# Patient Record
Sex: Female | Born: 1975
Health system: Southern US, Community
[De-identification: ages and names within clinical notes are randomized; demographics above are authoritative.]

## PROBLEM LIST (undated history)

## (undated) DIAGNOSIS — M1990 Unspecified osteoarthritis, unspecified site: Secondary | ICD-10-CM

## (undated) HISTORY — PX: TUBAL LIGATION: SHX77

---

## 2015-10-01 ENCOUNTER — Emergency Department (HOSPITAL_BASED_OUTPATIENT_CLINIC_OR_DEPARTMENT_OTHER)
Admission: EM | Admit: 2015-10-01 | Discharge: 2015-10-01 | Disposition: A | Discharge status: Home / Self Care | Payer: Medicaid Other | Attending: Emergency Medicine | Admitting: Emergency Medicine

## 2015-10-01 ENCOUNTER — Encounter (HOSPITAL_BASED_OUTPATIENT_CLINIC_OR_DEPARTMENT_OTHER): Payer: Self-pay | Admitting: *Deleted

## 2015-10-01 DIAGNOSIS — Z9851 Tubal ligation status: Secondary | ICD-10-CM | POA: Insufficient documentation

## 2015-10-01 DIAGNOSIS — N898 Other specified noninflammatory disorders of vagina: Secondary | ICD-10-CM | POA: Insufficient documentation

## 2015-10-01 DIAGNOSIS — K529 Noninfective gastroenteritis and colitis, unspecified: Secondary | ICD-10-CM | POA: Insufficient documentation

## 2015-10-01 DIAGNOSIS — Z3202 Encounter for pregnancy test, result negative: Secondary | ICD-10-CM | POA: Insufficient documentation

## 2015-10-01 LAB — URINALYSIS, ROUTINE W REFLEX MICROSCOPIC
BILIRUBIN URINE: NEGATIVE
Glucose, UA: NEGATIVE mg/dL
HGB URINE DIPSTICK: NEGATIVE
Ketones, ur: NEGATIVE mg/dL
Leukocytes, UA: NEGATIVE
NITRITE: NEGATIVE
PROTEIN: NEGATIVE mg/dL
SPECIFIC GRAVITY, URINE: 1.025 (ref 1.005–1.030)
pH: 5.5 (ref 5.0–8.0)

## 2015-10-01 LAB — PREGNANCY, URINE: PREG TEST UR: NEGATIVE

## 2015-10-01 MED ORDER — ONDANSETRON 8 MG PO TBDP
8.0000 mg | ORAL_TABLET | Freq: Three times a day (TID) | ORAL | Status: DC | PRN
Start: 2015-10-01 — End: 2022-08-05

## 2015-10-01 MED FILL — ONDANSETRON ODT 8 MG TABLET: 8 | 7 days supply | Qty: 20 | Fill #0

## 2015-10-01 NOTE — Discharge Instructions (Signed)

## 2015-10-01 NOTE — ED Notes (Signed)
Abdominal pain, vomiting and diarrhea since yesterday. States she feels like she has a "stomach virus" per pt.

## 2015-10-01 NOTE — ED Provider Notes (Signed)
CSN: [id number]     Arrival date & time 10/01/15  1410 History  By signing my name below, I, Budd Palmer, attest that this documentation has been prepared under the direction and in the presence of Margarita Grizzle, MD. Electronically Signed: Budd Palmer, ED Scribe. 10/01/2015. 4:27 PM.    Chief Complaint  Patient presents with  . Abdominal Pain   The history is provided by the patient. No language interpreter was used.   HPI Comments: Kimberly Briggs is a 40 y.o. female who presents to the Emergency Department complaining of constant, burning, cramping abdominal pain onset 3 days ago. She reports associated diarrhea (4-5x over the weekend, none today), nausea, and vomiting (resolved). She notes this feels like a previous stomach virus she has had in the past. She also notes vaginal discharge and frequency of urination, but states that this is baseline for her. She reports exacerbation of the pain with eating, and alleviation with lying supine. She notes she had a cough and rhinorrhea a few week ago, which have since resolved. She reports a PSHx of tubal ligation and notes her periods are regular. Her LNMP occurred on 12/13. She notes she drinks alcohol occasionally, but has not done so recently. 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 [facility] She denies smoking. Pt denies fever, chills, and dysuria.   History reviewed. No pertinent past medical history. Past Surgical History  Procedure Laterality Date  . Tubal ligation     No family history on file. Social History  Substance Use Topics  . Smoking status: Never Smoker   . Smokeless tobacco: None  . Alcohol Use: Yes     Comment: occ   OB History    No data available     Review of Systems  Constitutional: Negative for fever and chills.  Gastrointestinal: Positive for nausea, vomiting, abdominal pain and diarrhea.  Genitourinary: Positive for frequency  and vaginal discharge. Negative for dysuria.  All other systems reviewed and are negative.   Allergies  Review of patient's allergies indicates no known allergies.  Home Medications   Prior to Admission medications   Not on File   BP 142/86 mmHg  Pulse 86  Temp(Src) 98.1 F (36.7 C) (Oral)  Resp 16  SpO2 100%  LMP 09/11/2015 Physical Exam  Constitutional: She is oriented to person, place, and time. She appears well-developed and well-nourished.  HENT:  Head: Normocephalic and atraumatic.  Right Ear: External ear normal.  Left Ear: External ear normal.  Nose: Nose normal.  Mouth/Throat: Oropharynx is clear and moist.  Eyes: Conjunctivae and EOM are normal. Pupils are equal, round, and reactive to light.  Neck: Normal range of motion. Neck supple.  Cardiovascular: Normal rate, regular rhythm, normal heart sounds and intact distal pulses.   Pulmonary/Chest: Effort normal and breath sounds normal.  Abdominal: Soft. Bowel sounds are normal. She exhibits no distension and no mass. There is no tenderness. There is no rebound and no guarding.  Musculoskeletal: Normal range of motion.  Neurological: She is alert and oriented to person, place, and time. She has normal reflexes.  Skin: Skin is warm and dry.  Psychiatric: She has a normal mood and affect. Her behavior is normal. Judgment and thought content normal.  Nursing note and vitals reviewed.   ED Course  Procedures  DIAGNOSTIC STUDIES: Oxygen Saturation is 100% on RA, normal by my interpretation.    COORDINATION OF  CARE: 4:26 PM - Discussed probable viral infection. Discussed plans to order anti-nausea medication. Will write a note for work.  Pt advised of plan for treatment and pt agrees.  Labs Review Labs Reviewed  URINALYSIS, ROUTINE W REFLEX MICROSCOPIC (NOT AT [facility]) - Abnormal; Notable for the following:    APPearance CLOUDY (*)    All other components within normal limits  PREGNANCY, URINE    Imaging  Review No results found. I have personally reviewed and evaluated these images and lab results as part of my medical decision-making.   EKG Interpretation None      MDM   Final diagnoses:  Gastroenteritis    I personally performed the services described in this documentation, which was scribed in my presence. The recorded information has been reviewed and considered.   Margarita Grizzleanielle Zerah Hilyer, MD 10/01/15 [phone]

## 2015-10-01 NOTE — ED Notes (Signed)
Pt. Reports pain in her belly when she tries to eat.  Pt. Reports she tried to eat chicken fingers and it hurt her belly.  Pt. Has had NV&D since Sat. And none today.

## 2015-10-01 NOTE — ED Notes (Signed)
Pt. Reports she missed work today because she probably picked up a bug at work anyway.

## 2016-01-03 ENCOUNTER — Emergency Department (HOSPITAL_BASED_OUTPATIENT_CLINIC_OR_DEPARTMENT_OTHER)
Admission: EM | Admit: 2016-01-03 | Discharge: 2016-01-03 | Disposition: A | Discharge status: Home / Self Care | Payer: Medicaid Other | Attending: Emergency Medicine | Admitting: Emergency Medicine

## 2016-01-03 ENCOUNTER — Encounter (HOSPITAL_BASED_OUTPATIENT_CLINIC_OR_DEPARTMENT_OTHER): Payer: Self-pay

## 2016-01-03 ENCOUNTER — Emergency Department (HOSPITAL_BASED_OUTPATIENT_CLINIC_OR_DEPARTMENT_OTHER): Payer: Medicaid Other

## 2016-01-03 DIAGNOSIS — M1712 Unilateral primary osteoarthritis, left knee: Secondary | ICD-10-CM

## 2016-01-03 NOTE — ED Provider Notes (Signed)
CSN: [id number]     Arrival date & time 01/03/16  1949 History   First MD Initiated Contact with Patient 01/03/16 2001     Chief Complaint  Patient presents with  . Knee Pain    HPI   40 year old female presents today with complaints of knee pain. Patient reports symptoms have been present for approximately 3 months. She describes the pain is achy, worse with ambulation. She reports that occasionally she has sharp shooting pains in her knee. She notes that. The rest improved pain. She has not tried any medications prior to arrival. 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 Patient has no significant past medical history of knee pain. She denies any swelling, edema, redness, warmth to touch, or any systemic illnesses.  History reviewed. No pertinent past medical history. Past Surgical History  Procedure Laterality Date  . Tubal ligation     No family history on file. Social History  Substance Use Topics  . Smoking status: Never Smoker   . Smokeless tobacco: None  . Alcohol Use: Yes     Comment: occ   OB History    No data available     Review of Systems  All other systems reviewed and are negative.   Allergies  Review of patient's allergies indicates no known allergies.  Home Medications   Prior to Admission medications   Medication Sig Start Date End Date Taking? Authorizing Provider  ondansetron (ZOFRAN ODT) 8 MG disintegrating tablet Take 1 tablet (8 mg total) by mouth every 8 (eight) hours as needed for nausea or vomiting. 10/01/15   Margarita Grizzle, MD   BP 119/73 mmHg  Pulse 83  Temp(Src) 98.5 F (36.9 C) (Oral)  Resp 18  Ht  (1.727 m)  Wt 100.699 kg  BMI 33.76 kg/m2  SpO2 99%  LMP 12/25/2015   Physical Exam  Constitutional: She is oriented to person, place, and time. She appears well-developed and well-nourished.  HENT:  Head: Normocephalic and atraumatic.  Eyes: Conjunctivae are normal. Pupils are equal, round,  and reactive to light. Right eye exhibits no discharge. Left eye exhibits no discharge. No scleral icterus.  Neck: Normal range of motion. No JVD present. No tracheal deviation present.  Pulmonary/Chest: Effort normal. No stridor.  Musculoskeletal:  External inspection shows no asymmetry of the knees, no swelling, edema, warmth to touch. Full active pain-free range of motion of the left knee. No pain with patellar grind test, Lachman's negative, posterior drawer negative, no valgus or varus laxity noted. Strength 5 out of 5, sensation intact.  Neurological: She is alert and oriented to person, place, and time. Coordination normal.  Psychiatric: She has a normal mood and affect. Her behavior is normal. Judgment and thought content normal.  Nursing note and vitals reviewed.   ED Course  Procedures (including critical care time) Labs Review Labs Reviewed - No data to display  Imaging Review Dg Knee Complete 4 Views Left  01/03/2016  CLINICAL DATA:  Nontraumatic left anterior knee pain for 3 months. EXAM: LEFT KNEE - COMPLETE 4+ VIEW COMPARISON:  None. FINDINGS: Negative for acute fracture dislocation. There is no bone lesion or bony destruction. There are severe osteoarthritic changes, greatest in the medial compartment but also moderately severe in the patellofemoral and lateral compartments. No significant soft tissue abnormality is evident. IMPRESSION: Osteoarthritis.  No acute findings are evident Electronically Signed   By: Ellery Plunk M.D.   On: 01/03/2016 21:00  I have personally reviewed and evaluated these images and lab results as part of my medical decision-making.   EKG Interpretation None      MDM   Final diagnoses:  Osteoarthritis of left knee, unspecified osteoarthritis type    Labs:  Imaging:DG knee  Consults:  Therapeutics:  Discharge Meds:   Assessment/Plan: 40 year old female presents today with osteoarthritis of the left knee. Symptoms have been going  on for 3 months, no acute worsening. She has no signs of septic arthritis, no signs of gouty arthritis, no signs of trauma. X-ray shows osteoarthritis. Patient instructed to use Tylenol, follow up with sports medicine professional for further evaluation and management. Patient given return precautions, verbalized understanding and agreement to today's plan.         Eyvonne MechanicJeffrey Jamicia Haaland, PA-C 01/03/16 2109  Geoffery Lyonsouglas Delo, MD 01/03/16 [phone]

## 2016-01-03 NOTE — ED Notes (Signed)
Left knee pain for at least the last three months, no known injury

## 2016-01-03 NOTE — Discharge Instructions (Signed)

## 2017-04-19 IMAGING — DX DG KNEE COMPLETE 4+V*L*
4 series · 4 of 4 positions shown · non-contrast
Comparison: None.

CLINICAL DATA: Nontraumatic left anterior knee pain for 3 months.

EXAM:
LEFT KNEE - COMPLETE 4+ VIEW

[knee ap]
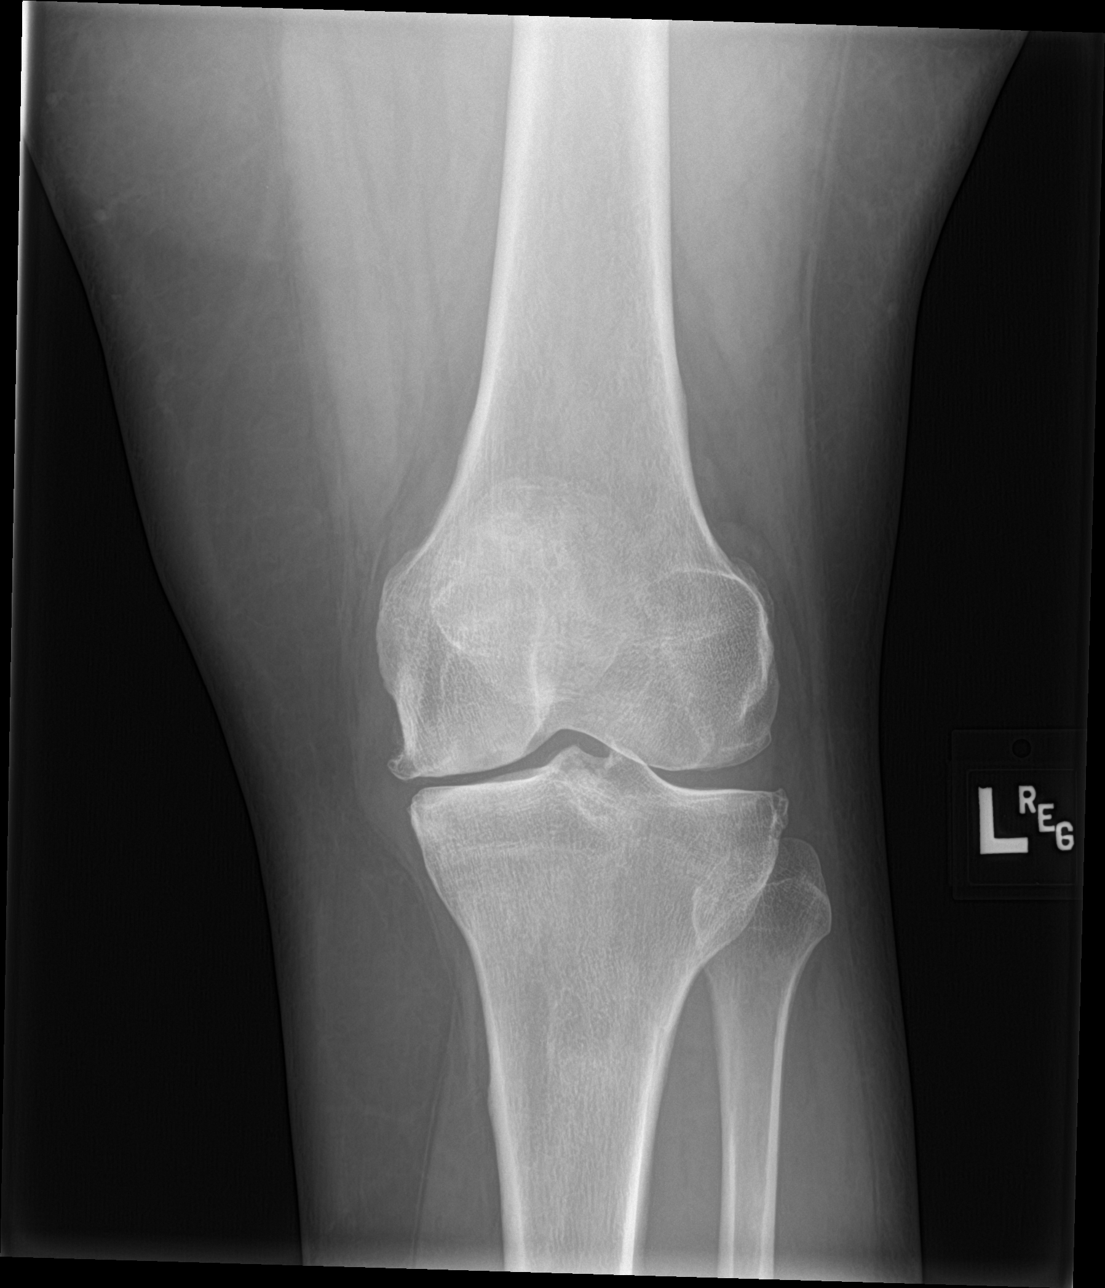

[knee lat]
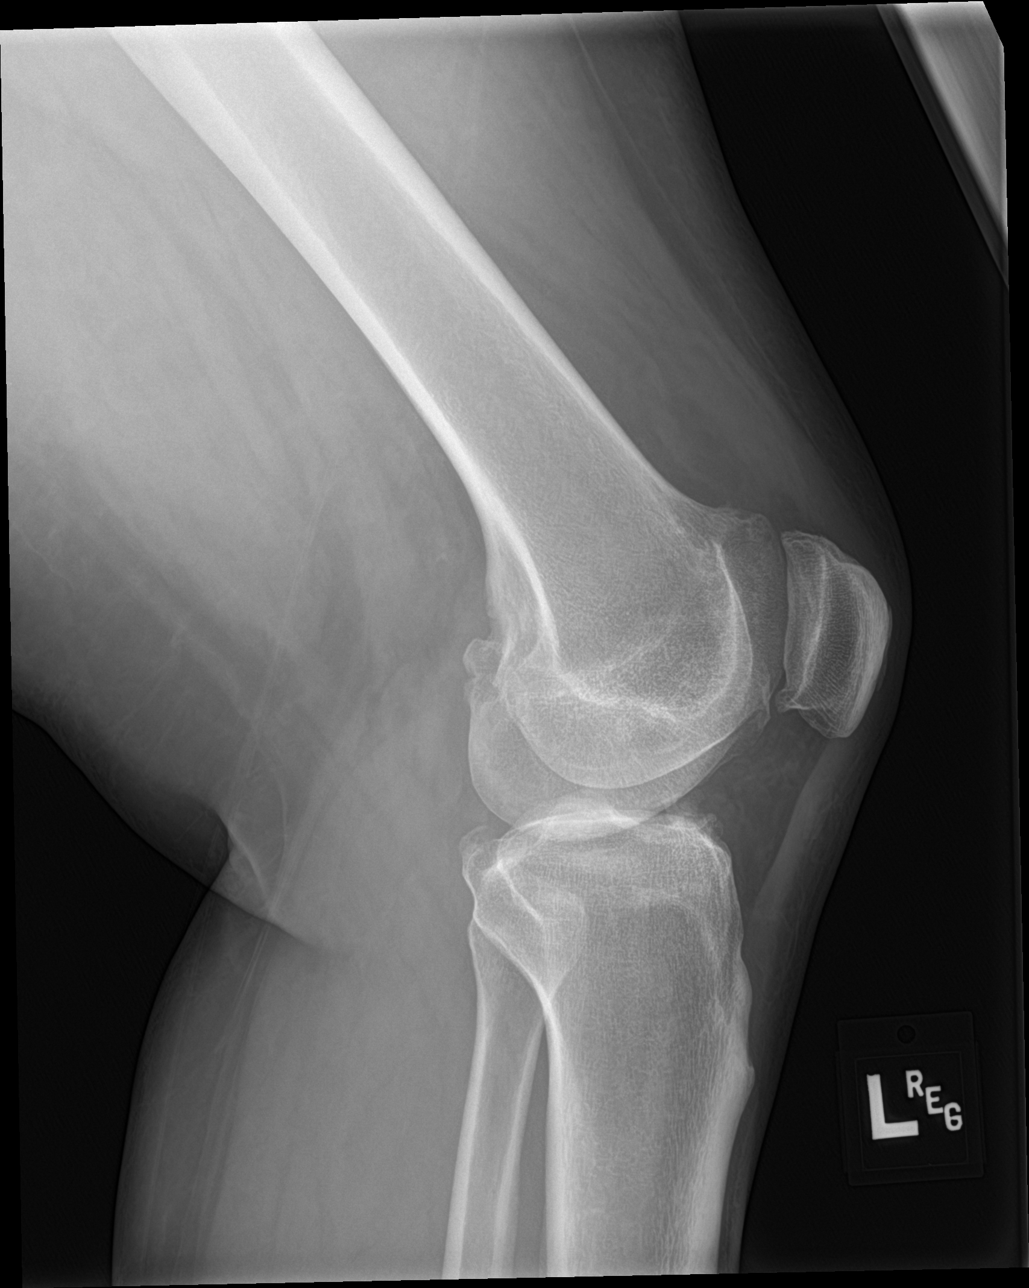

[knee obl (1 of 2)]
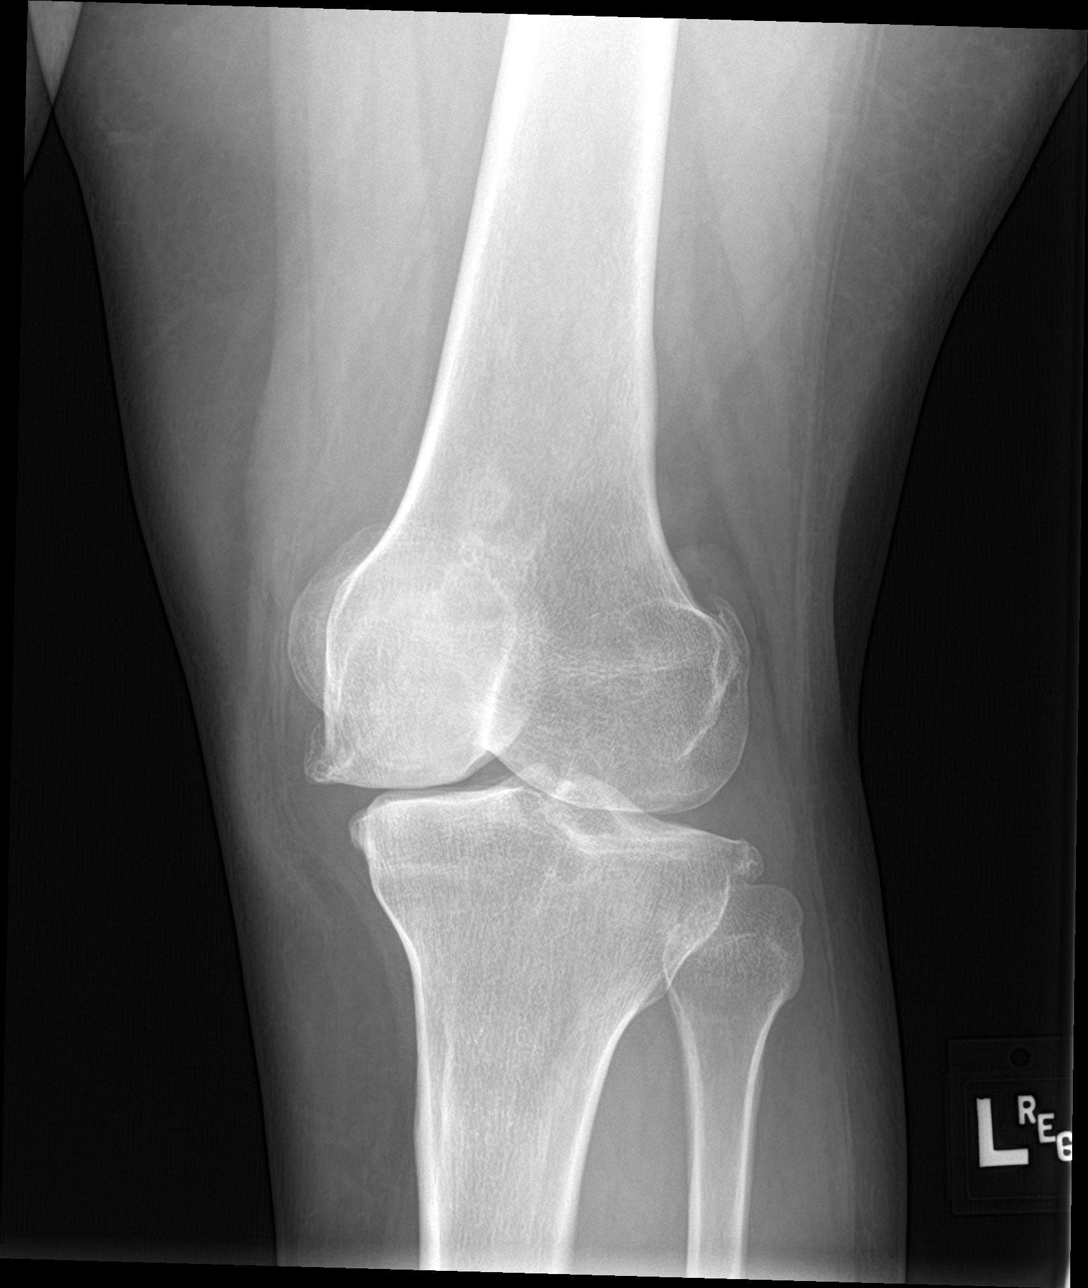

[knee obl (2 of 2)]
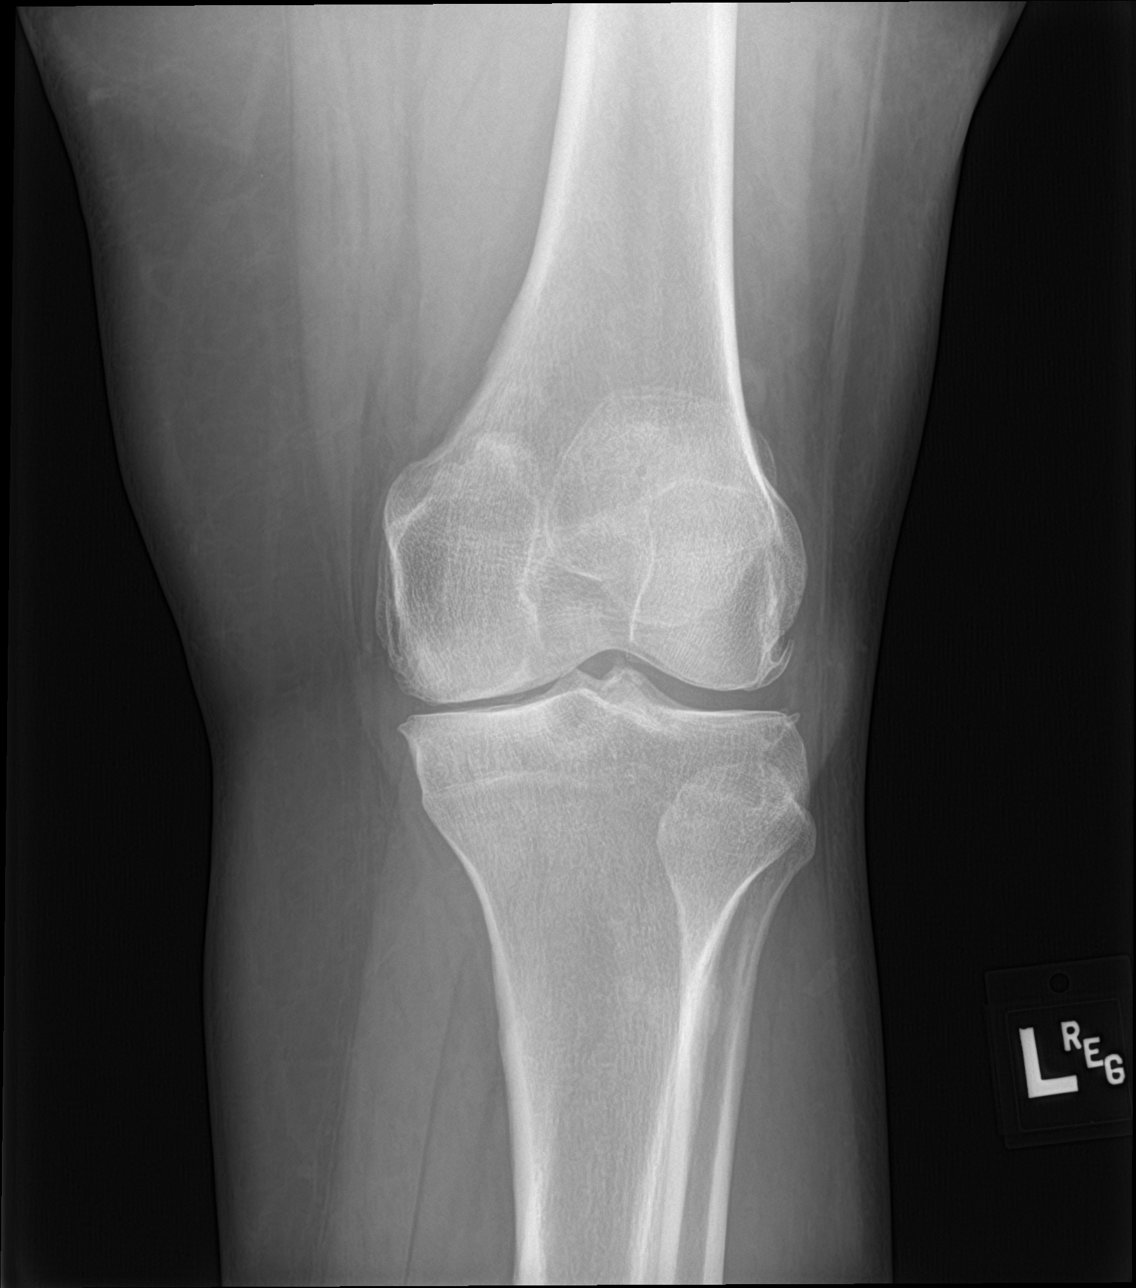

[4 of 4 positions shown; findings below may reference images not displayed]

FINDINGS: Negative for acute fracture dislocation. There is no bone lesion or
bony destruction. There are severe osteoarthritic changes, greatest
in the medial compartment but also moderately severe in the
patellofemoral and lateral compartments. No significant soft tissue
abnormality is evident.
IMPRESSION: Osteoarthritis.  No acute findings are evident

## 2022-07-31 ENCOUNTER — Emergency Department (HOSPITAL_COMMUNITY): Payer: BLUE CROSS/BLUE SHIELD

## 2022-07-31 ENCOUNTER — Other Ambulatory Visit: Payer: Self-pay

## 2022-07-31 ENCOUNTER — Inpatient Hospital Stay (HOSPITAL_COMMUNITY)
Admission: EM | Admit: 2022-07-31 | Discharge: 2022-08-05 | DRG: 099 | Disposition: A | Discharge status: Home / Self Care | Payer: BLUE CROSS/BLUE SHIELD | Attending: Internal Medicine | Admitting: Internal Medicine

## 2022-07-31 ENCOUNTER — Encounter (HOSPITAL_COMMUNITY): Payer: Self-pay

## 2022-07-31 DIAGNOSIS — G8929 Other chronic pain: Secondary | ICD-10-CM | POA: Diagnosis present

## 2022-07-31 DIAGNOSIS — Z885 Allergy status to narcotic agent status: Secondary | ICD-10-CM | POA: Diagnosis not present

## 2022-07-31 DIAGNOSIS — Z96652 Presence of left artificial knee joint: Secondary | ICD-10-CM | POA: Diagnosis present

## 2022-07-31 DIAGNOSIS — R339 Retention of urine, unspecified: Secondary | ICD-10-CM | POA: Diagnosis present

## 2022-07-31 DIAGNOSIS — R29898 Other symptoms and signs involving the musculoskeletal system: Secondary | ICD-10-CM | POA: Diagnosis not present

## 2022-07-31 DIAGNOSIS — D72829 Elevated white blood cell count, unspecified: Secondary | ICD-10-CM | POA: Diagnosis present

## 2022-07-31 DIAGNOSIS — R202 Paresthesia of skin: Secondary | ICD-10-CM | POA: Diagnosis not present

## 2022-07-31 DIAGNOSIS — G373 Acute transverse myelitis in demyelinating disease of central nervous system: Principal | ICD-10-CM

## 2022-07-31 DIAGNOSIS — R0789 Other chest pain: Secondary | ICD-10-CM | POA: Diagnosis present

## 2022-07-31 DIAGNOSIS — R531 Weakness: Secondary | ICD-10-CM

## 2022-07-31 DIAGNOSIS — R200 Anesthesia of skin: Principal | ICD-10-CM

## 2022-07-31 HISTORY — DX: Unspecified osteoarthritis, unspecified site: M19.90

## 2022-07-31 LAB — CBC WITH DIFFERENTIAL/PLATELET
Abs Immature Granulocytes: 0.07 10*3/uL (ref 0.00–0.07)
Basophils Absolute: 0 10*3/uL (ref 0.0–0.1)
Basophils Relative: 0 %
Eosinophils Absolute: 0 10*3/uL (ref 0.0–0.5)
Eosinophils Relative: 0 %
HCT: 39.7 % (ref 36.0–46.0)
Hemoglobin: 13.1 g/dL (ref 12.0–15.0)
Immature Granulocytes: 1 %
Lymphocytes Relative: 18 %
Lymphs Abs: 2.5 10*3/uL (ref 0.7–4.0)
MCH: 29.7 pg (ref 26.0–34.0)
MCHC: 33 g/dL (ref 30.0–36.0)
MCV: 90 fL (ref 80.0–100.0)
Monocytes Absolute: 0.6 10*3/uL (ref 0.1–1.0)
Monocytes Relative: 5 %
Neutro Abs: 10.3 10*3/uL — ABNORMAL HIGH (ref 1.7–7.7)
Neutrophils Relative %: 76 %
Platelets: 388 10*3/uL (ref 150–400)
RBC: 4.41 MIL/uL (ref 3.87–5.11)
RDW: 14.3 % (ref 11.5–15.5)
WBC: 13.5 10*3/uL — ABNORMAL HIGH (ref 4.0–10.5)
nRBC: 0 % (ref 0.0–0.2)

## 2022-07-31 LAB — COMPREHENSIVE METABOLIC PANEL
ALT: 13 U/L (ref 0–44)
AST: 21 U/L (ref 15–41)
Albumin: 3.6 g/dL (ref 3.5–5.0)
Alkaline Phosphatase: 61 U/L (ref 38–126)
Anion gap: 14 (ref 5–15)
BUN: 14 mg/dL (ref 6–20)
CO2: 23 mmol/L (ref 22–32)
Calcium: 9.7 mg/dL (ref 8.9–10.3)
Chloride: 104 mmol/L (ref 98–111)
Creatinine, Ser: 0.72 mg/dL (ref 0.44–1.00)
GFR, Estimated: >60 mL/min (ref >60)
Glucose, Bld: 97 mg/dL (ref 70–99)
Potassium: 4.2 mmol/L (ref 3.5–5.1)
Sodium: 141 mmol/L (ref 135–145)
Total Bilirubin: 0.3 mg/dL (ref 0.3–1.2)
Total Protein: 7.5 g/dL (ref 6.5–8.1)

## 2022-07-31 LAB — CSF CELL COUNT WITH DIFFERENTIAL
RBC Count, CSF: 0 /mm3
RBC Count, CSF: 82 /mm3 — ABNORMAL HIGH
Tube #: 4
WBC, CSF: 1 /mm3 (ref 0–5)
WBC, CSF: 3 /mm3 (ref 0–5)

## 2022-07-31 LAB — CK: Total CK: 362 U/L — ABNORMAL HIGH (ref 38–234)

## 2022-07-31 LAB — VITAMIN B12: Vitamin B-12: 521 pg/mL (ref 180–914)

## 2022-07-31 LAB — PROTEIN AND GLUCOSE, CSF
Glucose, CSF: 59 mg/dL (ref 40–70)
Total  Protein, CSF: 29 mg/dL (ref 15–45)

## 2022-07-31 MED ORDER — ACETAMINOPHEN 650 MG RE SUPP: 650.0000 mg | Freq: Four times a day (QID) | RECTAL | Status: DC | PRN

## 2022-07-31 MED ORDER — ACETAMINOPHEN 325 MG PO TABS
650.0000 mg | ORAL_TABLET | Freq: Four times a day (QID) | ORAL | Status: DC | PRN
  Administered 2022-08-03 – 2022-08-04 (×2): 650 mg via ORAL
  Filled 2022-07-31 (×2): qty 2

## 2022-07-31 MED ORDER — GADOBUTROL 1 MMOL/ML IV SOLN
10.0000 mL | Freq: Once | INTRAVENOUS | Status: AC | PRN
  Administered 2022-07-31: 10 mL via INTRAVENOUS

## 2022-07-31 NOTE — ED Provider Notes (Signed)
[place] EMERGENCY DEPARTMENT Provider Note   CSN: [id number] Arrival date & time: 07/31/22  1205     History  Chief Complaint  Patient presents with   Numbness    Kimberly Briggs is a 46 y.o. female.  Patient is a 46 yo female with pmh of lumbar disc disease presenting for numbness in the lower extremity. Pt admits to hx of URI on Monday, Oct 23rd 2023. States she sick with sore throat, nasal congestion, and productive cough from the 23-30th. [place]. Admits to resolution of URI infection on Oct 31th but began having a burning sensation/muscle spasm sensation in the bilateral lower extremities with difficulty ambulating. States she went to [facility] at this time received an MR spine thoracic and lumbar wo contrast in the ED demonstrating: 1. Normal MRI appearance of the conus medullaris and cauda equina.  2. Small central to right subarticular disc protrusion at L5-S1,  closely approximating and/or potentially affecting either of the  descending S1 nerve roots, greater on the right.  3. Small right foraminal to extraforaminal disc protrusion at L2-3,  potentially irritating the exiting right L2 nerve root.  4. Moderate bilateral facet hypertrophy at L2-3 through L4-5, which  could contribute to lower back pain.   Patient was discharged with outpatient follow up with Dr. Maia Petties with [facility] in [place] today.   Current symptoms include sensation changes in bilateral lower extremities including numbness in buttocks, vaginal area, and bilateral lower extremities. Pt states " my legs feel cold". Sensation deficits are worse on the right  lower extremity. Pt also states she has feelings of urinary retention at [facility] and had to be straight cath. States she is able to empty her bladder at this time.  Denies any motor/movement deficits. Admits to difficulty walking due to  numbness.      The history is provided by the patient. No language interpreter was used.       Home Medications Prior to Admission medications   Medication Sig Start Date End Date Taking? Authorizing Provider  ondansetron (ZOFRAN ODT) 8 MG disintegrating tablet Take 1 tablet (8 mg total) by mouth every 8 (eight) hours as needed for nausea or vomiting. 10/01/15   Pattricia Boss, MD      Allergies    Codeine    Review of Systems   Review of Systems  Constitutional:  Negative for chills and fever.  HENT:  Negative for ear pain and sore throat.   Eyes:  Negative for pain and visual disturbance.  Respiratory:  Negative for cough and shortness of breath.   Cardiovascular:  Negative for chest pain and palpitations.  Gastrointestinal:  Negative for abdominal pain and vomiting.  Genitourinary:  Negative for dysuria and hematuria.  Musculoskeletal:  Negative for arthralgias and back pain.  Skin:  Negative for color change and rash.  Neurological:  Positive for numbness. Negative for seizures and syncope.  All other systems reviewed and are negative.   Physical Exam Updated Vital Signs BP (!) 128/104 (BP Location: Right Arm)   Pulse 64   Temp 98.6 F (37 C) (Oral)   Resp 20   Ht 5\' 8"  (1.727 m)   Wt 100.7 kg   SpO2 97%   BMI 33.75 kg/m  Physical Exam Vitals and nursing note reviewed.  Constitutional:      General: She is not in acute distress.    Appearance: She is well-developed.  HENT:     Head: Normocephalic and atraumatic.  Eyes:     Conjunctiva/sclera: Conjunctivae normal.  Cardiovascular:     Rate and Rhythm: Normal rate and regular rhythm.     Heart sounds: No murmur heard. Pulmonary:     Effort: Pulmonary effort is normal. No respiratory distress.     Breath sounds: Normal breath sounds.  Abdominal:     Palpations: Abdomen is soft.     Tenderness: There is no abdominal tenderness.  Musculoskeletal:        General: No swelling.     Cervical back: Neck  supple.  Skin:    General: Skin is warm and dry.     Capillary Refill: Capillary refill takes less than 2 seconds.  Neurological:     Mental Status: She is alert and oriented to person, place, and time.     GCS: GCS eye subscore is 4. GCS verbal subscore is 5. GCS motor subscore is 6.     Cranial Nerves: Cranial nerves 2-12 are intact.     Sensory: Sensory deficit present.     Motor: Motor function is intact.     Coordination: Coordination is intact.     Comments: Pt able to detect touch. Decreased sensation in the left when compared to the right. Able to detect light versus deep touch. Able to detect sharp versus dull.   Psychiatric:        Mood and Affect: Mood normal.     ED Results / Procedures / Treatments   Labs (all labs ordered are listed, but only abnormal results are displayed) Labs Reviewed  CBC WITH DIFFERENTIAL/PLATELET - Abnormal; Notable for the following components:      Result Value   WBC 13.5 (*)    Neutro Abs 10.3 (*)    All other components within normal limits  CK - Abnormal; Notable for the following components:   Total CK 362 (*)    All other components within normal limits  CSF CULTURE W GRAM STAIN  COMPREHENSIVE METABOLIC PANEL  VITAMIN B12  CSF CELL COUNT WITH DIFFERENTIAL  PROTEIN AND GLUCOSE, CSF  IGG CSF INDEX  DRAW EXTRA CLOT TUBE  OLIGOCLONAL BANDS, CSF + SERM  DRAW EXTRA CLOT TUBE    EKG None  Radiology No results found.  Procedures .Critical Care  Performed by: Franne Forts, DO Authorized by: Franne Forts, DO   Critical care provider statement:    Critical care time (minutes):  30   Critical care was necessary to treat or prevent imminent or life-threatening deterioration of the following conditions: lower extremity sensation loss.   Critical care was time spent personally by me on the following activities:  Development of treatment plan with patient or surrogate, discussions with consultants, evaluation of patient's response  to treatment, examination of patient, ordering and review of laboratory studies, ordering and review of radiographic studies, ordering and performing treatments and interventions, pulse oximetry, re-evaluation of patient's condition and review of old charts   Care discussed with comment:  Neurology     Medications Ordered in ED Medications - No data to display  ED Course/ Medical Decision Making/ A&P                           Medical Decision Making Amount and/or Complexity of Data Reviewed Labs: ordered. Radiology: ordered.   46 yo female with pmh of lumbar disc disease presenting for numbness in the lower extremity. Pt is  Aox3, no acute distress, afebrile, with stable vitals. Tearful on exam secondary to concern for lower extremities. LE demonstrate no swelling, erythema, rashes, ecchymosis, or wounds. DP easily palpable. No motor deficits. Decreased sensation on right side as compared to left. Able to detect light touch and deep pressure. Able to detect sharp versus dull.  Differential diagnosis includes but is not limited to MS, transverse myelitis, early Guillain-Barr.   Review demonstrates MR spine thoracic and lumbar wo contrast at [facility] on 07/29/2022 demonstrating: 1. Normal MRI appearance of the conus medullaris and cauda equina.  2. Small central to right subarticular disc protrusion at L5-S1,  closely approximating and/or potentially affecting either of the  descending S1 nerve roots, greater on the right.  3. Small right foraminal to extraforaminal disc protrusion at L2-3,  potentially irritating the exiting right L2 nerve root.  4. Moderate bilateral facet hypertrophy at L2-3 through L4-5, which  could contribute to lower back pain.   I spoke with on-call neurology team who recommends an MRI cervical spine, thoracic spine, and lumbar spine with contrast as well as an LP.  Signs of early Guillain-Barr.  MRI ordered.  Oncoming physician Dr. Fredderick Phenix will  perform lumbar puncture and follow MRI results.  Plans to reconsult neurology when results are back.         Final Clinical Impression(s) / ED Diagnoses Final diagnoses:  Loss of feeling or sensation    Rx / DC Orders ED Discharge Orders     None         Mariene, Dickerman, DO 07/31/22 1523

## 2022-07-31 NOTE — Consult Note (Signed)
Neurology Consultation  Reason for Consult: Sudden onset of bilateral lower extremity weakness, abnormal MRI Referring Physician: Dr. Fredderick Phenix  CC: Lower extremity weakness  History is obtained from: Patient, chart  HPI: Kimberly Briggs is a 46 y.o. female past medical history of chronic lower back pain presenting to the emergency room for evaluation of lower extremity numbness and weakness. She reports that she had a URI last week but had a trip planned to the [place] which she went down at the end of last week and returned back on Sunday.  She was not 100% normal-had congestion in her sinuses and felt somewhat sick with upper respiratory symptoms but was able to work.  Monday was fine.  Tuesday morning she started noticing burning sensation in her feet as well as a sensation of muscle spasm in her legs.  She was also having urinary retention. She went to the emergency department at [facility] had an MRI of the lumbar spine as well as thoracic spine-07/29/2022-with no acute issues.  Was given a dose of steroids and discharged home.  Her symptoms continue to worsen to the point that she was not able to walk normally and was also having urinary retention that was worsening, which made her come to the emergency room at [facility].  Chart review also reveals that she had an urgent care visit for low back pain and slight vaginal discharge as well as exposure to STD from a sexual partner for trichomonas.  She was tested and was positive and was given antibiotics-course that she completed.  In the emergency room at [facility], she got an MRI of the thoracic and lumbar spine repeated which shows patchy signal abnormality involving the distal cord/conus medullaris at the level of T12/L1 which is new since prior MRI from 07/29/2022.  Findings are nonspecific but could reflect changes of transverse myelitis which could be either infectious or inflammatory in nature.  Demyelinating  process could also be considered but there is no associated abnormal contrast-enhancement.  There is no visible enhancement of the nerve roots of the cauda equina. Degenerative spondylosis of the lumbar and thoracic spine stable from 2 days ago. Fibroid uterus.  1.7 cm soft tissue lesion protrudes into the endometrial cavity possible it is a submucosal fibroid but incompletely assessed on this exam.  Further exam with pelvic ultrasound should be considered.  ROS: Full ROS was performed and is negative except as noted in the HPI.   History reviewed. No pertinent past medical history.  History reviewed. No pertinent family history.  Social History:   reports that she has never smoked. She does not have any smokeless tobacco history on file. She reports current alcohol use. She reports that she does not use drugs.  Medications No current facility-administered medications for this encounter.  Current Outpatient Medications:    ondansetron (ZOFRAN ODT) 8 MG disintegrating tablet, Take 1 tablet (8 mg total) by mouth every 8 (eight) hours as needed for nausea or vomiting., Disp: 20 tablet, Rfl: 0   Exam: Current vital signs: BP (!) 143/79   Pulse 74   Temp 98.6 F (37 C) (Oral)   Resp 17   Ht 5\' 8"  (1.727 m)   Wt 100.7 kg   SpO2 99%   BMI 33.75 kg/m  Vital signs in last 24 hours: Temp:  [98.3 F (36.8 C)-98.6 F (37 C)] 98.6 F (37 C) (11/02 1850) Pulse Rate:  [62-86] 74 (11/02 1930) Resp:  [16-20] 17 (11/02 1930)  BP: (128-143)/(78-104) 143/79 (11/02 1930) SpO2:  [97 %-99 %] 99 % (11/02 1930) Weight:  [100.7 kg] 100.7 kg (11/02 1221) General: Awake alert HEENT: Normocephalic atraumatic Lungs: Clear Card vascular: Regular rhythm Abdomen nondistended nontender Extremities warm well perfused Neurologic exam Awake alert oriented x3 Speech is clear No evidence of dysarthria or aphasia Cranial nerves II to XII intact Motor examination with 5/5 strength in bilateral upper  extremities without drift.  There is no drift in the lower extremities but the right plantar and dorsiflexion is 3/5, left plantar and dorsiflexion is 4/5.  Hip and knee flexors bilaterally are 4+/5. Sensation: Diminished bilaterally on the lateral thighs but no saddle anesthesia.  Reports subjective numbness on her vaginal area, but not in the anal area. Coordination: No dysmetria in the upper extremity.  Dysmetric in the lower extremities. DTRs: 2+ in upper extremities, 2-3+ knee jerks bilaterally, 2+ ankle jerks bilaterally,.  Upon eliciting plantar reflex, she has some withdrawal but no definitive upgoing toe. No clonus  Labs I have reviewed labs in epic and the results pertinent to this consultation are:  CBC    Component Value Date/Time   WBC 13.5 (H) 07/31/2022 1257   RBC 4.41 07/31/2022 1257   HGB 13.1 07/31/2022 1257   HCT 39.7 07/31/2022 1257   PLT 388 07/31/2022 1257   MCV 90.0 07/31/2022 1257   MCH 29.7 07/31/2022 1257   MCHC 33.0 07/31/2022 1257   RDW 14.3 07/31/2022 1257   LYMPHSABS 2.5 07/31/2022 1257   MONOABS 0.6 07/31/2022 1257   EOSABS 0.0 07/31/2022 1257   BASOSABS 0.0 07/31/2022 1257    CMP     Component Value Date/Time   NA 141 07/31/2022 1257   K 4.2 07/31/2022 1257   CL 104 07/31/2022 1257   CO2 23 07/31/2022 1257   GLUCOSE 97 07/31/2022 1257   BUN 14 07/31/2022 1257   CREATININE 0.72 07/31/2022 1257   CALCIUM 9.7 07/31/2022 1257   PROT 7.5 07/31/2022 1257   ALBUMIN 3.6 07/31/2022 1257   AST 21 07/31/2022 1257   ALT 13 07/31/2022 1257   ALKPHOS 61 07/31/2022 1257   BILITOT 0.3 07/31/2022 1257   GFRNONAA >60 07/31/2022 1257    CSF: Clear, tube #1 and tube #4-WBCs 1 and 3, RBC 0 and 82.  Glucose 59, protein 29  Imaging I have reviewed the images obtained:  In the emergency room at [facility], she got an MRI of the thoracic and lumbar spine repeated which shows patchy signal abnormality involving the distal cord/conus medullaris at the  level of T12/L1 which is new since prior MRI from 07/29/2022.  Findings are nonspecific but could reflect changes of transverse myelitis which could be either infectious or inflammatory in nature.  Demyelinating process could also be considered but there is no associated abnormal contrast-enhancement.  There is no visible enhancement of the nerve roots of the cauda equina. Degenerative spondylosis of the lumbar and thoracic spine stable from 2 days ago. Fibroid uterus.  1.7 cm soft tissue lesion protrudes into the endometrial cavity possible it is a submucosal fibroid but incompletely assessed on this exam.  Further exam with pelvic ultrasound should be considered.  She also had MRI of the cervical spine which demonstrated normal MRI appearance of the cervical spinal cord.  Multilevel spondylosis.  Assessment:  46 year old with history of chronic low back pain presented to the emergency room for evaluation of lower extremity burning followed by numbness and weakness as well as urinary retention since  this past Tuesday. She had a respiratory infection last week, traveled to the [place] on a planned vacation, continued to feel somewhat upper respiratory symptoms but not until Tuesday that she noted difficulty walking which started after burning sensation and a sensation of muscle spasms in her legs. Spinal cord imaging shows T12-L1 signal abnormality without enhancement. Has a history of STD recently. With a history of travel and STD, differentials are broad and include infectious versus postinfectious etiology versus inflammatory etiology. CSF studies are not very suggestive of inflammatory or infectious etiology but this could be very early course of transverse myelitis when enhancement and inflammatory markers are yet not evident.  Also, if she has HIV or is immunocompromise, she might not mount adequate responses. Irrespective, she needs a thorough work-up for this lower spinal cord transverse  myelitis with broad differential that showed still include infectious versus inflammatory etiology.  Clinical picture, imaging with no enhancement of the nerve roots and CSF findings do not favor Guillain-Barr.  Impression: Lower thoracic/upper lumbar spinal cord transverse myelitis-differentials broad and include autoimmune processes, infectious processes, postinfectious processes.  Case reports of anti-MOG antibodies associated disease with conus medullaris syndrome have also been reported hence we will check the antibodies as well as check for neuromyelitis optica as well.  Viral infectious etiology will also be checked.  May need ID involvement for suggestions on other infectious causes based on travel and STD history   Recommendations: Due to rapid onset of symptoms and CSF not suggestive of infection, I would still treat this as a transverse myelitis with high dose steroids. Solu-Medrol 1 g IV daily for 5 days.  Prior to this, will check UA and chest x-ray to make sure there is no active infection. I would also check IgG index, oligoclonal bands and anti-NMO antibodies as well as anti-MOG antibodies. Discussed with neuroradiology and interventional radiology-no indication for spinal angiogram at this time. She has a recent history of STD.  We will also check RPR, HIV and HTLV. For other etiologies that can involve the spinal cord, I would recommend checking angiotensin-converting enzyme in serum and CSF, antinuclear antibodies, ANCA's.  I would also add HSV, VZV, EBV as well as Chad Nile virus and Lyme serology Given recent travel to the [place], other than HTLV, HIV and HSV VZV EBV as well as Chad Nile-I would run this case with ID in the morning to see if they have any other recommendations.  There is no abnormal enhancement which makes schistosomiasis less likely but with her travel history, exotic infections not common in our geographic area should also be considered.  Morning team  to reach out to ID.  Plan relayed to Dr. Arlean Hopping, admitting hospitalist -- Milon Dikes, MD Neurologist [facility] Pager: [phone]

## 2022-07-31 NOTE — ED Triage Notes (Signed)
[facility] for numbness to bilateral lower extremities and numbness to buttock and perineal area.  Patient is unable to walk.  Complains of fluid retention and tearful in triage. Was at a spine doc who wrote recommendation on a paper for patient to have tested here.  Patient did have vrius prior to symptoms starting.

## 2022-07-31 NOTE — H&P (Addendum)
History and Physical    PLEASE NOTE THAT DRAGON DICTATION SOFTWARE WAS USED IN THE CONSTRUCTION OF THIS NOTE.   Kimberly Briggs EHU:[id number] DOB: 03-Dec-1975 DOA: 07/31/2022  PCP: Patient, No Pcp Per  (will further assess) Patient coming from: home   I have personally briefly reviewed patient's old medical records in [place]  Chief Complaint: b/l lower extremity weakness  HPI: Kimberly Briggs is a 46 y.o. female with medical history significant for osteoarthritis involving the bilateral lower extremities who is admitted to [facility] on 07/31/2022 with weakness of the bilateral lower extremities after presenting from home to [facility] ED complaining of such.   The patient reports new onset numbness and paresthesias involving, initially, the bilateral feet starting 3 days ago.  Since onset, she has noted ascension of these sensory changes into the proximal legs bilaterally and into the buttocks and vagina, but denies involvement of the anus.  Additionally, over the last 2 days, she has noted new onset weakness in the bilateral lower extremities, with the weakness in the right lower extremity greater than the left.  Denies any acute weakness involving the upper extremities, and denies any acute sensory deficits involving the upper extremities.  This is In the absence of any associated facial droop, slurred speech, expressive aphasia, acute change in vision, dysphagia, vertigo, headache.  No recent trauma.  Denies any significant new onset back discomfort.  She had just vacationed in the [place], having returned home 4 to 5 days ago.  She notes that she was experiencing a mild nonproductive cough last week that is also subsequently resolved.  Additionally, she was recently tested positive for trichomonas, the testing which was prompted by her partner being notified of a prior partner that had tested positive.  Denies any recent subjective fever, chills, rigors, or generalized myalgias.   No recent dysuria or gross hematuria.  No rash.  No residual cough or and no recent shortness of breath.   No known history of autoimmune disorder, neuromuscular disorder.  She initially presented with these complaints to [facility] emergency department, at which time MRI of the lumbar spine demonstrated no evidence of acute process.  She was subsequently discharged home from [facility] emergency department, before subsequently presenting to [facility] emergency department given further progression of the above symptoms.     ED Course:  Vital signs in the ED were notable for the following: Afebrile; heart rate 78-58; systolic blood pressures in the 120s 140s; respiratory rate 16-20, oxygen saturation 97 and 9% on room air.  Labs were notable for the following: CMP notable for creatinine 0.72, glucose 97, adjusted calcium 10.1, albumin 3.6, otherwise, liver enzymes within normal limits.  CPK 362, B12 552.  CBC notable for will with cell count 13,500 with 76% neutrophils.  LP was performed this evening, with CSF analysis notable for 3 white blood cells, 59 glucose, 9 protein, and Gram stain showed no organisms.  Imaging and additional notable ED work-up: MRI with and without contrast of the cervical spine showed no evidence of acute process.  MRI of the thoracic/lumbar spine with and without contrast showed interval development, relative to MRI on 07/29/2022, of nonspecific patchy signal abnormality involving the distal cord/conus medullaris at the level of T12/L1, while showing no evidence of involvement of the nerve roots of the cauda equina.  EDP discussed the patient's case and imaging with the on-call neurologist, Dr. Rory Percy, Who will formally consult.  He notes that presentation/imaging was less suggestive  of DM Barr as well as transverse myelitis.  The timing of her travel, trichomonas infection, did not appear to be contributory to her presenting symptoms.  He has expanded evaluation  with additional several CSF/serum laboratory studies, including the following oligoclonal bands, CSF and serum, IgG CSF index; neuro myelitis antibody; antimyelin glycoprotein antibodies, serum ACE, CSF ACE, Lyme serology, RPR, ANCA, ANA, HIV.  Additionally, Dr. Rory Percy rec Initiation of systemic corticosteroids, but recommends first checking urinalysis/chest x-ray to further evaluate for underlying infectious process prior to the initiation of steroids.  Subsequently, the patient was admitted for further evaluation management of her presenting acute bilateral lower extremity weakness associated with new onset ascending sensory changes in the bilateral lower extremities, of unclear etiology at this time.    Review of Systems: As per HPI otherwise 10 point review of systems negative.   History reviewed. No pertinent past medical history.  Past Surgical History:  Procedure Laterality Date   TUBAL LIGATION      Social History:  reports that she has never smoked. She does not have any smokeless tobacco history on file. She reports current alcohol use. She reports that she does not use drugs.   Allergies  Allergen Reactions   Codeine Itching    History reviewed. No pertinent family history.   Prior to Admission medications   Medication Sig Start Date End Date Taking? Authorizing Provider  ondansetron (ZOFRAN ODT) 8 MG disintegrating tablet Take 1 tablet (8 mg total) by mouth every 8 (eight) hours as needed for nausea or vomiting. 10/01/15   Pattricia Boss, MD     Objective    Physical Exam: Vitals:   07/31/22 1224 07/31/22 1645 07/31/22 1850 07/31/22 1930  BP: (!) 128/104 130/78 129/89 (!) 143/79  Pulse: 64 62 86 74  Resp: _0 Temp: 98.6 F (37 C) 98.3 F (36.8 C) 98.6 F (37 C)   TempSrc: Oral Oral Oral   SpO2: 97% 99% 99% 99%  Weight:      Height:        General: appears to be stated age; alert, oriented Skin: warm, dry, no rash Head:  AT/[place] Mouth:  Oral mucosa  membranes appear moist, normal dentition Neck: supple; trachea midline Heart:  RRR; did not appreciate any M/R/G Lungs: CTAB, did not appreciate any wheezes, rales, or rhonchi Abdomen: + BS; soft, ND, NT Vascular: 2+ pedal pulses b/l; 2+ radial pulses b/l Extremities: no peripheral edema, no muscle wasting Neuro: sensation deficit to light touch in the bilateral lower extremities; 3/5 strength in rle, 4/5 strength in lle.  Sensation and strength intact in bilateral upper extremities      Labs on Admission: I have personally reviewed following labs and imaging studies  CBC: Recent Labs  Lab 07/31/22 1257  WBC 13.5*  NEUTROABS 10.3*  HGB 13.1  HCT 39.7  MCV 90.0  PLT 638   Basic Metabolic Panel: Recent Labs  Lab 07/31/22 1257  NA 141  K 4.2  CL 104  CO2 23  GLUCOSE 97  BUN 14  CREATININE 0.72  CALCIUM 9.7   GFR: Estimated Creatinine Clearance: 106.7 mL/min (by C-G formula based on SCr of 0.72 mg/dL). Liver Function Tests: Recent Labs  Lab 07/31/22 1257  AST 21  ALT 13  ALKPHOS 61  BILITOT 0.3  PROT 7.5  ALBUMIN 3.6   No results for input(s): "LIPASE", "AMYLASE" in the last 168 hours. No results for input(s): "AMMONIA" in the last 168 hours. Coagulation Profile: No  results for input(s): "INR", "PROTIME" in the last 168 hours. Cardiac Enzymes: Recent Labs  Lab 07/31/22 1257  CKTOTAL 362*   BNP (last 3 results) No results for input(s): "PROBNP" in the last 8760 hours. HbA1C: No results for input(s): "HGBA1C" in the last 72 hours. CBG: No results for input(s): "GLUCAP" in the last 168 hours. Lipid Profile: No results for input(s): "CHOL", "HDL", "LDLCALC", "TRIG", "CHOLHDL", "LDLDIRECT" in the last 72 hours. Thyroid Function Tests: No results for input(s): "TSH", "T4TOTAL", "FREET4", "T3FREE", "THYROIDAB" in the last 72 hours. Anemia Panel: Recent Labs    07/31/22 1439  VITAMINB12 521   Urine analysis:    Component Value Date/Time    COLORURINE YELLOW 10/01/2015 1415   APPEARANCEUR CLOUDY (A) 10/01/2015 1415   LABSPEC 1.025 10/01/2015 1415   PHURINE 5.5 10/01/2015 1415   GLUCOSEU NEGATIVE 10/01/2015 1415   HGBUR NEGATIVE 10/01/2015 1415   BILIRUBINUR NEGATIVE 10/01/2015 1415   KETONESUR NEGATIVE 10/01/2015 1415   PROTEINUR NEGATIVE 10/01/2015 1415   NITRITE NEGATIVE 10/01/2015 1415   LEUKOCYTESUR NEGATIVE 10/01/2015 1415    Radiological Exams on Admission: MR Lumbar Spine W Wo Contrast  Result Date: 07/31/2022 CLINICAL DATA:  Initial evaluation for loss of sensation in bilateral lower extremities, trunk numbness, tingling. EXAM: MRI THORACIC AND LUMBAR SPINE WITHOUT AND WITH CONTRAST TECHNIQUE: Multiplanar and multiecho pulse sequences of the thoracic and lumbar spine were obtained without and with intravenous contrast. CONTRAST:  28m GADAVIST GADOBUTROL 1 MMOL/ML IV SOLN COMPARISON:  Comparison made with recent MRI from 07/29/2022. FINDINGS: MRI THORACIC SPINE FINDINGS Alignment: Physiologic with preservation of the normal thoracic kyphosis. No listhesis. Vertebrae: Vertebral body height maintained without acute or interval fracture. Bone marrow signal intensity within normal limits. No discrete or worrisome osseous lesions. No abnormal marrow edema or enhancement. Cord: Now evident is patchy signal abnormality involving the distal cord/conus medullaris at the level of T12-L1 (series 12, image 8). No appreciable associated enhancement. This appears to be new and/or at least now visible since prior MRI from 07/19/2022. A shin remainder of the thoracic cord remains otherwise normal in appearance. Paraspinal and other soft tissues: Paraspinous soft tissues demonstrate no acute finding. Small simple cyst noted within the left kidney, benign in appearance, no follow-up imaging recommended. Disc levels: Previously identified small disc protrusions at T5-6 and T8-9 without significant stenosis or impingement, stable. No other new or  significant disc pathology. No stenosis or impingement. MRI LUMBAR SPINE FINDINGS Segmentation: Standard. Lowest well-formed disc space labeled the L5-S1 level. Alignment: Trace facet mediated anterolisthesis of L3 on L4 and L4 on L5. Vertebrae: Vertebral body height maintained without acute or interval fracture. Bone marrow signal intensity heterogeneous but overall within normal limits. Benign hemangioma noted within the S1 vertebral body. No worrisome osseous lesions. No other significant abnormal marrow edema or enhancement. Conus medullaris: Extends to the L1 level and appears normal. Patchy signal abnormality involving the distal cord/conus medullaris (series 19, image 8). No discernible associated enhancement. Nerve roots of the cauda equina and themselves relatively normal in appearance without significant nerve root thickening or enhancement. Paraspinal and other soft tissues: Paraspinous soft tissues demonstrate no acute finding. Probable fibroid uterus noted. 1.7 cm soft tissue density protrudes into the endometrial cavity, possibly a submucosal fibroid, but indeterminate (series 19, image 7). Disc levels: Underlying degenerative disc disease and facet hypertrophy, not significantly changed from recent MRI from 07/29/2022, fully described on that examination. IMPRESSION: 1. Patchy signal abnormality involving the distal cord/conus medullaris at the level of  T12-L1, new since prior MRI from 07/29/2022. Findings are nonspecific, but could reflect changes of transverse myelitis, which could be either infectious or inflammatory in nature. A demyelinating process could also be considered. No associated enhancement. No visible involvement of the nerve roots of the cauda equina. 2. Otherwise stable MRI of the thoracic and lumbar spine with underlying degenerative spondylosis, fully described or recent MRI from 07/29/2022. 3. Fibroid uterus. 1.7 cm soft tissue lesion protrudes into the endometrial cavity,  possibly a submucosal fibroid, but incompletely assessed on this exam. Further evaluation with dedicated pelvic ultrasound recommended. Electronically Signed   By: Jeannine Boga M.D.   On: 07/31/2022 19:46   MR THORACIC SPINE W WO CONTRAST  Result Date: 07/31/2022 CLINICAL DATA:  Initial evaluation for loss of sensation in bilateral lower extremities, trunk numbness, tingling. EXAM: MRI THORACIC AND LUMBAR SPINE WITHOUT AND WITH CONTRAST TECHNIQUE: Multiplanar and multiecho pulse sequences of the thoracic and lumbar spine were obtained without and with intravenous contrast. CONTRAST:  70m GADAVIST GADOBUTROL 1 MMOL/ML IV SOLN COMPARISON:  Comparison made with recent MRI from 07/29/2022. FINDINGS: MRI THORACIC SPINE FINDINGS Alignment: Physiologic with preservation of the normal thoracic kyphosis. No listhesis. Vertebrae: Vertebral body height maintained without acute or interval fracture. Bone marrow signal intensity within normal limits. No discrete or worrisome osseous lesions. No abnormal marrow edema or enhancement. Cord: Now evident is patchy signal abnormality involving the distal cord/conus medullaris at the level of T12-L1 (series 12, image 8). No appreciable associated enhancement. This appears to be new and/or at least now visible since prior MRI from 07/19/2022. A shin remainder of the thoracic cord remains otherwise normal in appearance. Paraspinal and other soft tissues: Paraspinous soft tissues demonstrate no acute finding. Small simple cyst noted within the left kidney, benign in appearance, no follow-up imaging recommended. Disc levels: Previously identified small disc protrusions at T5-6 and T8-9 without significant stenosis or impingement, stable. No other new or significant disc pathology. No stenosis or impingement. MRI LUMBAR SPINE FINDINGS Segmentation: Standard. Lowest well-formed disc space labeled the L5-S1 level. Alignment: Trace facet mediated anterolisthesis of L3 on L4 and L4  on L5. Vertebrae: Vertebral body height maintained without acute or interval fracture. Bone marrow signal intensity heterogeneous but overall within normal limits. Benign hemangioma noted within the S1 vertebral body. No worrisome osseous lesions. No other significant abnormal marrow edema or enhancement. Conus medullaris: Extends to the L1 level and appears normal. Patchy signal abnormality involving the distal cord/conus medullaris (series 19, image 8). No discernible associated enhancement. Nerve roots of the cauda equina and themselves relatively normal in appearance without significant nerve root thickening or enhancement. Paraspinal and other soft tissues: Paraspinous soft tissues demonstrate no acute finding. Probable fibroid uterus noted. 1.7 cm soft tissue density protrudes into the endometrial cavity, possibly a submucosal fibroid, but indeterminate (series 19, image 7). Disc levels: Underlying degenerative disc disease and facet hypertrophy, not significantly changed from recent MRI from 07/29/2022, fully described on that examination. IMPRESSION: 1. Patchy signal abnormality involving the distal cord/conus medullaris at the level of T12-L1, new since prior MRI from 07/29/2022. Findings are nonspecific, but could reflect changes of transverse myelitis, which could be either infectious or inflammatory in nature. A demyelinating process could also be considered. No associated enhancement. No visible involvement of the nerve roots of the cauda equina. 2. Otherwise stable MRI of the thoracic and lumbar spine with underlying degenerative spondylosis, fully described or recent MRI from 07/29/2022. 3. Fibroid uterus. 1.7 cm soft tissue  lesion protrudes into the endometrial cavity, possibly a submucosal fibroid, but incompletely assessed on this exam. Further evaluation with dedicated pelvic ultrasound recommended. Electronically Signed   By: Jeannine Boga M.D.   On: 07/31/2022 19:46   MR Cervical Spine  W or Wo Contrast  Result Date: 07/31/2022 CLINICAL DATA:  Initial evaluation for sensation loss in bilateral lower extremities. EXAM: MRI CERVICAL SPINE WITHOUT AND WITH CONTRAST TECHNIQUE: Multiplanar and multiecho pulse sequences of the cervical spine, to include the craniocervical junction and cervicothoracic junction, were obtained without and with intravenous contrast. CONTRAST:  26m GADAVIST GADOBUTROL 1 MMOL/ML IV SOLN COMPARISON:  None Available. FINDINGS: Alignment: Examination degraded by motion. Straightening of the normal cervical lordosis.  No listhesis. Vertebrae: Vertebral body height maintained without acute or chronic fracture. Bone marrow signal intensity within normal limits. No discrete or worrisome osseous lesions. No abnormal marrow edema or enhancement. Cord: Normal signal and morphology.  No abnormal enhancement. Posterior Fossa, vertebral arteries, paraspinal tissues: Small bilateral mastoid effusions partially visualized. Otherwise unremarkable. Disc levels: C2-C3: Right eccentric disc osteophyte complex with bilateral uncovertebral spurring. No significant spinal stenosis. Foramina remain adequately patent. C3-C4: Diffuse disc bulge with bilateral uncovertebral spurring, asymmetric to the right. Flattening and partial effacement of the ventral thecal sac. Mild spinal stenosis with moderate bilateral C4 foraminal narrowing. C4-C5: Degenerative intervertebral disc space narrowing with diffuse disc osteophyte complex. Flattening and partial effacement of the ventral thecal sac with resultant mild spinal stenosis. Severe right with moderate left C5 foraminal stenosis. C5-C6: Degenerative intervertebral disc space narrowing with diffuse disc osteophyte complex. Flattening and partial effacement of the ventral thecal sac. Mild spinal stenosis. Severe right with moderate left C6 foraminal narrowing. C6-C7:  Unremarkable. C7-T1: Negative interspace. Mild left-sided facet hypertrophy. No  stenosis. IMPRESSION: 1. Normal MRI appearance of the cervical spinal cord. 2. Multilevel cervical spondylosis with resultant mild spinal stenosis at C3-4 through C5-6. 3. Multifactorial degenerative changes with resultant multilevel foraminal narrowing as above. Notable findings include moderate bilateral C4 foraminal stenosis, with severe right and moderate left C5 and C6 foraminal narrowing. Electronically Signed   By: BJeannine BogaM.D.   On: 07/31/2022 18:54      Assessment/Plan   Principal Problem:   Lower extremity weakness Active Problems:   Leukocytosis   Paresthesias       #) Bilateral lower extremity weakness: Presents with 3 days of ascending strength/sensory deficits, as further detailed above, with repeat MRI thoracic/lumbar spine performed this evening and read by the same radiologist prn corresponding MRI 07/29/2022 showing interval development of nonspecific patchy signal abnormality involving the distal cord/conus medullaris at the level of T12/L1. Overall, etiology unclear at this time.   Dr. ARory Percyof neurology has been formally consulted and evaluated the patient in the emergency department this evening. He notes that presentation/imaging was less suggestive of DM Barr as well as transverse myelitis. Additionally, he conveys that the timing of her travel, trichomonas infection, did not appear to be contributory to her presenting symptoms.  He has expanded evaluation with additional CSF/serum laboratory studies, including the following: serum/csf oligoclonal bands, IgG CSF index; neuro myelitis antibody; antimyelin glycoprotein antibodies, serum ACE, CSF ACE, Lyme serology, RPR, ANCA, ANA, HIV.  Additionally, Dr. ARory Percyrec initiation of systemic corticosteroids, but recommends first checking urinalysis/chest x-ray to further evaluate for underlying infectious process prior to the initiation of steroids.   Plan: Neurology formally consulted, as above.  will follow for  results of expanded CSF/serum laboratory evaluation, as outlined above and  recommended by neurology.  Per neurology, may also benefit from infectious disease consultation.  Check urinalysis, chest x-ray, following which we will plan for initiation of systemic corticosteroids, as above.  Every 4 hours neurochecks ordered.  Repeat CBC in the morning.  Fall precautions         #) Leukocytosis: Presenting CBC reflects mildly elevated will with cell count of 02,111 with neutrophilic predominance.  No overt evidence of underlying infectious process at this time, although expansion of infectious work-up is underway, as above.  Additionally, will pursue chest x-ray and urinalysis prior to initiation of systemic corticosteroids, as recommended by neurology.  Potential inflammatory contribution, particularly given the nature of the presentation and interval development of nonspecific findings on MRI thoracic/lumbar spine, as further detailed above.  Of note, no additional SIRS criteria met at this time.  Overall, criteria for sepsis not currently met.  She appears hemodynamically stable.  Potential further exacerbation leukocytosis to occur following anticipated initiation of systemic steroids.  Plan: Refraining from initiation of antibiotics for now.  Chest x-ray, urinalysis.  Repeat CBC with differential in the morning.  Further serum/CSF infectious evaluation, as outlined above.  CMP in the morning.       DVT prophylaxis: SCD's   Code Status: Full code Family Communication: none Disposition Plan: Per Rounding Team Consults called: Dr. Rory Percy of neurology formally consulted, as further detailed above;  Admission status: Inpatient    PLEASE NOTE THAT DRAGON DICTATION SOFTWARE WAS USED IN THE CONSTRUCTION OF THIS NOTE.   [place] DO [facility]  From [place]   07/31/2022, 10:59 PM

## 2022-07-31 NOTE — ED Provider Notes (Signed)
Procedure Note  Procedures  .Lumbar Puncture  Date/Time: 07/31/2022 3:47 PM  Performed by: Renard Matter, MD Authorized by: Malvin Johns, MD   Consent:    Consent obtained:  Written   Consent given by:  Patient   Risks, benefits, and alternatives were discussed: yes     Risks discussed:  Bleeding, infection, pain, headache and nerve damage   Alternatives discussed:  Observation, alternative treatment, delayed treatment and no treatment Universal protocol:    Procedure explained and questions answered to patient or proxy's satisfaction: yes     Relevant documents present and verified: yes     Test results available: yes     Required blood products, implants, devices, and special equipment available: yes     Immediately prior to procedure a time out was called: yes     Site/side marked: yes     Patient identity confirmed:  Verbally with patient and hospital-assigned identification number Pre-procedure details:    Procedure purpose:  Diagnostic   Preparation: Patient was prepped and draped in usual sterile fashion   Sedation:    Sedation type:  None Anesthesia:    Anesthesia method:  Local infiltration   Local anesthetic: lidocaine. Procedure details:    Lumbar space:  L4-L5 interspace   Patient position:  Sitting   Epidural needle gauge: 25.   Ultrasound guidance: no     Number of attempts:  3   Fluid appearance:  Clear   Tubes of fluid:  4 Post-procedure details:    Puncture site:  Adhesive bandage applied and direct pressure applied   Procedure completion:  Tolerated well, no immediate complications        Renard Matter, MD 07/31/22 1548    Malvin Johns, MD 07/31/22 2117

## 2022-07-31 NOTE — ED Provider Triage Note (Signed)
Emergency Medicine Provider Triage Evaluation Note  Kimberly Briggs , a 46 y.o. female  was evaluated in triage.  Pt complains of ongoing lateral lower extremity numbness and weakness that started 3 to 4 days ago.  Patient had a viral illness preceding this.  Was seen at external emergency department and had MRI as below.  Was given a course of steroids which has not changed symptoms, however they have not gotten significantly worse since previous ED visit.  She was seen today by a spine specialist and referred to the emergency department.  Also reports recent urinary retention.  History of left knee replacement.  MRI lumbar spine 07/29/2022: IMPRESSION: 1. Normal MRI appearance of the conus medullaris and cauda equina. 2. Small central to right subarticular disc protrusion at L5-S1, closely approximating and/or potentially affecting either of the descending S1 nerve roots, greater on the right. 3. Small right foraminal to extraforaminal disc protrusion at L2-3, potentially irritating the exiting right L2 nerve root. 4. Moderate bilateral facet hypertrophy at L2-3 through L4-5, which could contribute to lower back pain.    Review of Systems  Positive: Numbness, weakness Negative: Fever  Physical Exam  There were no vitals taken for this visit. Gen:   Awake, no distress   Resp:  Normal effort  MSK:   Moves extremities without difficulty  Other:  Patient reports weakness bilateral lower extremities with numbness; in wheelchair but patellar reflexes seem decreased.  Medical Decision Making  Medically screening exam initiated at 12:12 PM.  Appropriate orders placed.  Kimberly Briggs was informed that the remainder of the evaluation will be completed by another provider, this initial triage assessment does not replace that evaluation, and the importance of remaining in the ED until their evaluation is complete.     Carlisle Cater, PA-C 07/31/22 1225

## 2022-07-31 NOTE — ED Notes (Signed)
Pt OTF in MRI 

## 2022-07-31 NOTE — ED Provider Notes (Signed)
Patient care with taken over from Dr. Veleta Miners.  She presented with some progressive numbness and weakness to her lower extremities.  She had had some urinary retention which has improved over the last couple days.  This just started within the last 2 to 3 days.  LP was performed.  Cell counts are nonconcerning.  Protein and glucose are normal.  MRI was performed of her spine.  There is an area of inflammation in the left lower conus area.  Dr. Rory Percy has seen the patient.  He will consult and make recommendations.  Pt to be admitted to the unassigned medicine service.  I spoke with Dr. Eugenia Pancoast who will admit the patient for further treatment.   Malvin Johns, MD 07/31/22 2250

## 2022-08-01 ENCOUNTER — Encounter (HOSPITAL_COMMUNITY): Payer: Self-pay | Admitting: Internal Medicine

## 2022-08-01 ENCOUNTER — Inpatient Hospital Stay (HOSPITAL_COMMUNITY): Payer: BLUE CROSS/BLUE SHIELD

## 2022-08-01 DIAGNOSIS — G373 Acute transverse myelitis in demyelinating disease of central nervous system: Secondary | ICD-10-CM | POA: Diagnosis present

## 2022-08-01 DIAGNOSIS — R202 Paresthesia of skin: Secondary | ICD-10-CM | POA: Diagnosis present

## 2022-08-01 DIAGNOSIS — D72829 Elevated white blood cell count, unspecified: Secondary | ICD-10-CM | POA: Diagnosis present

## 2022-08-01 DIAGNOSIS — R29898 Other symptoms and signs involving the musculoskeletal system: Secondary | ICD-10-CM | POA: Diagnosis not present

## 2022-08-01 LAB — MENINGITIS/ENCEPHALITIS PANEL (CSF)

## 2022-08-01 LAB — MAGNESIUM: Magnesium: 2.1 mg/dL (ref 1.7–2.4)

## 2022-08-01 LAB — CBC WITH DIFFERENTIAL/PLATELET
Abs Immature Granulocytes: 0.05 10*3/uL (ref 0.00–0.07)
Basophils Absolute: 0 10*3/uL (ref 0.0–0.1)
Basophils Relative: 0 %
Eosinophils Absolute: 0.1 10*3/uL (ref 0.0–0.5)
Eosinophils Relative: 1 %
HCT: 38.1 % (ref 36.0–46.0)
Hemoglobin: 11.9 g/dL — ABNORMAL LOW (ref 12.0–15.0)
Immature Granulocytes: 1 %
Lymphocytes Relative: 38 %
Lymphs Abs: 3.4 10*3/uL (ref 0.7–4.0)
MCH: 28.8 pg (ref 26.0–34.0)
MCHC: 31.2 g/dL (ref 30.0–36.0)
MCV: 92.3 fL (ref 80.0–100.0)
Monocytes Absolute: 0.7 10*3/uL (ref 0.1–1.0)
Monocytes Relative: 8 %
Neutro Abs: 4.5 10*3/uL (ref 1.7–7.7)
Neutrophils Relative %: 52 %
Platelets: 312 10*3/uL (ref 150–400)
RBC: 4.13 MIL/uL (ref 3.87–5.11)
RDW: 14.5 % (ref 11.5–15.5)
WBC: 8.7 10*3/uL (ref 4.0–10.5)
nRBC: 0 % (ref 0.0–0.2)

## 2022-08-01 LAB — URINALYSIS, COMPLETE (UACMP) WITH MICROSCOPIC
Bilirubin Urine: NEGATIVE
Glucose, UA: NEGATIVE mg/dL
Hgb urine dipstick: NEGATIVE
Ketones, ur: NEGATIVE mg/dL
Leukocytes,Ua: NEGATIVE
Nitrite: NEGATIVE
Protein, ur: NEGATIVE mg/dL
Specific Gravity, Urine: 1.016 (ref 1.005–1.030)
pH: 5 (ref 5.0–8.0)

## 2022-08-01 LAB — COMPREHENSIVE METABOLIC PANEL
ALT: 13 U/L (ref 0–44)
AST: 20 U/L (ref 15–41)
Albumin: 3.1 g/dL — ABNORMAL LOW (ref 3.5–5.0)
Alkaline Phosphatase: 57 U/L (ref 38–126)
Anion gap: 8 (ref 5–15)
BUN: 17 mg/dL (ref 6–20)
CO2: 24 mmol/L (ref 22–32)
Calcium: 8.8 mg/dL — ABNORMAL LOW (ref 8.9–10.3)
Chloride: 106 mmol/L (ref 98–111)
Creatinine, Ser: 0.85 mg/dL (ref 0.44–1.00)
GFR, Estimated: >60 mL/min (ref >60)
Glucose, Bld: 110 mg/dL — ABNORMAL HIGH (ref 70–99)
Potassium: 3.5 mmol/L (ref 3.5–5.1)
Sodium: 138 mmol/L (ref 135–145)
Total Bilirubin: 0.6 mg/dL (ref 0.3–1.2)
Total Protein: 6.6 g/dL (ref 6.5–8.1)

## 2022-08-01 LAB — RPR: RPR Ser Ql: NONREACTIVE

## 2022-08-01 LAB — HIV ANTIBODY (ROUTINE TESTING W REFLEX): HIV Screen 4th Generation wRfx: NONREACTIVE

## 2022-08-01 LAB — RAPID URINE DRUG SCREEN, HOSP PERFORMED
Amphetamines: NOT DETECTED
Barbiturates: NOT DETECTED
Benzodiazepines: NOT DETECTED
Cocaine: NOT DETECTED
Opiates: NOT DETECTED
Tetrahydrocannabinol: NOT DETECTED

## 2022-08-01 LAB — TSH: TSH: 2.723 u[IU]/mL (ref 0.350–4.500)

## 2022-08-01 MED ORDER — SODIUM CHLORIDE 0.9 % IV SOLN
1000.0000 mg | INTRAVENOUS | Status: AC
  Administered 2022-08-01 – 2022-08-05 (×5): 1000 mg via INTRAVENOUS
  Filled 2022-08-01 (×5): qty 16

## 2022-08-01 NOTE — Progress Notes (Signed)
Secure chat sent to nurse. Infusion almost complete per nurse. Instructed to re-consult for next dose at needed. VU Fran Lowes, RN VAST

## 2022-08-01 NOTE — Progress Notes (Signed)
PROGRESS NOTE    Kimberly CohoCatrice Michl  ZOX:[id number] DOB: August 03, 1976 DOA: 07/31/2022 PCP: Patient, No Pcp Per     Brief Narrative:  Kimberly Briggs is a 46 y.o. female with medical history significant for osteoarthritis involving the bilateral lower extremities who is [facility] to [facility] on 07/[facility]/2022 with weakness of the bilateral lower extremities. The patient reports new onset numbness and paresthesias involving, initially, the bilateral feet starting 3 days ago.  Since onset, she has noted ascension of these sensory changes into the proximal legs bilaterally and into the buttocks and vagina, but denies involvement of the anus.  Additionally, over the last 2 days, she has noted new onset weakness in the bilateral lower extremities, with the weakness in the right lower extremity greater than the left.   She initially presented with these complaints to [facility] regional emergency department, at which time MRI of the lumbar spine was completed. She was referred to outpatient spine specialist who did not have further recommendations, other than MRI with contrast. Patient decided to come to [facility] ED due to continued symptoms.   Neurology consulted.   New events last 24 hours / Subjective: Patient has been able to ambulate around the room, has been urinating on her own.  Continues to have numbness in her groin, weakness right lower extremity greater than left.  Denies any fevers or chills at home.  Completed treatment for trichomonas last month.  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  Assessment & Plan:   Principal Problem:   Lower extremity weakness Active Problems:   Leukocytosis   Paresthesias   Bilateral lower extremity weakness, question transverse myelitis -MRI: Patchy signal abnormality involving the distal cord/conus medullaris at the level of T12-L1 -Neurology following -Additional CSF/serum labs ordered -UA and chest x-ray negative for acute infectious process.  Can start  steroids per Neuro  -PT  -?  Infectious disease consultation     DVT prophylaxis:  SCDs Start: 07/31/22 2257  Code Status: Full Family Communication: None at bedside Disposition Plan:  Status is: Inpatient Remains inpatient appropriate because: Neuro work up   Consultants:  Neurology   Procedures:  None   Antimicrobials:  Anti-infectives (From admission, onward)    None        Objective: Vitals:   08/01/22 0030 08/01/22 0645 08/01/22 1045 08/01/22 1111  BP: 114/71 114/85 120/77   Pulse: 65 63 73   Resp: 16 15 18    Temp: 98.1 F (36.7 C) 98.7 F (37.1 C)  98.5 F (36.9 C)  TempSrc: Oral Oral  Oral  SpO2: 96% 99% 99%   Weight:      Height:       No intake or output data in the 24 hours ending 08/01/22 1315 Filed Weights   07/31/22 1221  Weight: 100.7 kg    Examination: General exam: Appears calm and comfortable  Respiratory system: Clear to auscultation. Respiratory effort normal. No respiratory distress. No conversational dyspnea.  Cardiovascular system: S1 & S2 heard, RRR. No murmurs. No pedal edema. Gastrointestinal system: Abdomen is nondistended, soft and nontender. Normal bowel sounds heard. Central nervous system: Alert and oriented. Speech clear. +RLE weaker compared to LLE  Extremities: Symmetric in appearance  Skin: No rashes, lesions or ulcers on exposed skin  Psychiatry: Judgement and insight appear normal. Mood & affect appropriate.   Data Reviewed: I have personally reviewed following labs and imaging studies  CBC: Recent Labs  Lab 07/31/22 1257 08/01/22 0512  WBC 13.5* 8.7  NEUTROABS 10.3* 4.5  HGB 13.1 11.9*  HCT 39.7 38.1  MCV 90.0 92.3  PLT 388 312   Basic Metabolic Panel: Recent Labs  Lab 07/31/22 1257 08/01/22 0512  NA 141 138  K 4.2 3.5  CL 104 106  CO2 23 24  GLUCOSE 97 110*  BUN 14 17  CREATININE 0.72 0.85  CALCIUM 9.7 8.8*  MG  --  2.1   GFR: Estimated Creatinine Clearance: 100.4 mL/min (by C-G formula  based on SCr of 0.85 mg/dL). Liver Function Tests: Recent Labs  Lab 07/31/22 1257 08/01/22 0512  AST 21 20  ALT 13 13  ALKPHOS 61 57  BILITOT 0.3 0.6  PROT 7.5 6.6  ALBUMIN 3.6 3.1*   No results for input(s): "LIPASE", "AMYLASE" in the last 168 hours. No results for input(s): "AMMONIA" in the last 168 hours. Coagulation Profile: No results for input(s): "INR", "PROTIME" in the last 168 hours. Cardiac Enzymes: Recent Labs  Lab 07/31/22 1257  CKTOTAL 362*   BNP (last 3 results) No results for input(s): "PROBNP" in the last 8760 hours. HbA1C: No results for input(s): "HGBA1C" in the last 72 hours. CBG: No results for input(s): "GLUCAP" in the last 168 hours. Lipid Profile: No results for input(s): "CHOL", "HDL", "LDLCALC", "TRIG", "CHOLHDL", "LDLDIRECT" in the last 72 hours. Thyroid Function Tests: Recent Labs    08/01/22 0511  TSH 2.723   Anemia Panel: Recent Labs    07/31/22 1439  VITAMINB12 521   Sepsis Labs: No results for input(s): "PROCALCITON", "LATICACIDVEN" in the last 168 hours.  Recent Results (from the past 240 hour(s))  CSF culture w Gram Stain     Status: None (Preliminary result)   Collection Time: 07/31/22  3:50 PM   Specimen: CSF; Cerebrospinal Fluid  Result Value Ref Range Status   Specimen Description CSF  Final   Special Requests NONE  Final   Gram Stain NO WBC SEEN NO ORGANISMS SEEN CYTOSPIN SMEAR   Final   Culture   Final    NO GROWTH < 24 HOURS Performed at [facility], [address] [place], [place] [address]    Report Status PENDING  Incomplete      Radiology Studies: DG Chest Port 1 View  Result Date: 08/01/2022 CLINICAL DATA:  Leukocytosis EXAM: PORTABLE CHEST 1 VIEW COMPARISON:  None Available. FINDINGS: The heart size and mediastinal contours are within normal limits. Both lungs are clear. The visualized skeletal structures are unremarkable. IMPRESSION: Negative Electronically Signed   By: Charlett Nose M.D.   On:  08/01/2022 02:13   MR Lumbar Spine W Wo Contrast  Result Date: 07/31/2022 CLINICAL DATA:  Initial evaluation for loss of sensation in bilateral lower extremities, trunk numbness, tingling. EXAM: MRI THORACIC AND LUMBAR SPINE WITHOUT AND WITH CONTRAST TECHNIQUE: Multiplanar and multiecho pulse sequences of the thoracic and lumbar spine were obtained without and with intravenous contrast. CONTRAST:  21mL GADAVIST GADOBUTROL 1 MMOL/ML IV SOLN COMPARISON:  Comparison made with recent MRI from 07/29/2022. FINDINGS: MRI THORACIC SPINE FINDINGS Alignment: Physiologic with preservation of the normal thoracic kyphosis. No listhesis. Vertebrae: Vertebral body height maintained without acute or interval fracture. Bone marrow signal intensity within normal limits. No discrete or worrisome osseous lesions. No abnormal marrow edema or enhancement. Cord: Now evident is patchy signal abnormality involving the distal cord/conus medullaris at the level of T12-L1 (series 12, image 8). No appreciable associated enhancement. This appears to be new and/or at least now visible since prior MRI from 07/19/2022.  A shin remainder of the thoracic cord remains otherwise normal in appearance. Paraspinal and other soft tissues: Paraspinous soft tissues demonstrate no acute finding. Small simple cyst noted within the left kidney, benign in appearance, no follow-up imaging recommended. Disc levels: Previously identified small disc protrusions at T5-6 and T8-9 without significant stenosis or impingement, stable. No other new or significant disc pathology. No stenosis or impingement. MRI LUMBAR SPINE FINDINGS Segmentation: Standard. Lowest well-formed disc space labeled the L5-S1 level. Alignment: Trace facet mediated anterolisthesis of L3 on L4 and L4 on L5. Vertebrae: Vertebral body height maintained without acute or interval fracture. Bone marrow signal intensity heterogeneous but overall within normal limits. Benign hemangioma noted within  the S1 vertebral body. No worrisome osseous lesions. No other significant abnormal marrow edema or enhancement. Conus medullaris: Extends to the L1 level and appears normal. Patchy signal abnormality involving the distal cord/conus medullaris (series 19, image 8). No discernible associated enhancement. Nerve roots of the cauda equina and themselves relatively normal in appearance without significant nerve root thickening or enhancement. Paraspinal and other soft tissues: Paraspinous soft tissues demonstrate no acute finding. Probable fibroid uterus noted. 1.7 cm soft tissue density protrudes into the endometrial cavity, possibly a submucosal fibroid, but indeterminate (series 19, image 7). Disc levels: Underlying degenerative disc disease and facet hypertrophy, not significantly changed from recent MRI from 07/29/2022, fully described on that examination. IMPRESSION: 1. Patchy signal abnormality involving the distal cord/conus medullaris at the level of T12-L1, new since prior MRI from 07/29/2022. Findings are nonspecific, but could reflect changes of transverse myelitis, which could be either infectious or inflammatory in nature. A demyelinating process could also be considered. No associated enhancement. No visible involvement of the nerve roots of the cauda equina. 2. Otherwise stable MRI of the thoracic and lumbar spine with underlying degenerative spondylosis, fully described or recent MRI from 07/29/2022. 3. Fibroid uterus. 1.7 cm soft tissue lesion protrudes into the endometrial cavity, possibly a submucosal fibroid, but incompletely assessed on this exam. Further evaluation with dedicated pelvic ultrasound recommended. Electronically Signed   By: Rise Mu M.D.   On: 07/31/2022 19:46   MR THORACIC SPINE W WO CONTRAST  Result Date: 07/31/2022 CLINICAL DATA:  Initial evaluation for loss of sensation in bilateral lower extremities, trunk numbness, tingling. EXAM: MRI THORACIC AND LUMBAR SPINE  WITHOUT AND WITH CONTRAST TECHNIQUE: Multiplanar and multiecho pulse sequences of the thoracic and lumbar spine were obtained without and with intravenous contrast. CONTRAST:  52mL GADAVIST GADOBUTROL 1 MMOL/ML IV SOLN COMPARISON:  Comparison made with recent MRI from 07/29/2022. FINDINGS: MRI THORACIC SPINE FINDINGS Alignment: Physiologic with preservation of the normal thoracic kyphosis. No listhesis. Vertebrae: Vertebral body height maintained without acute or interval fracture. Bone marrow signal intensity within normal limits. No discrete or worrisome osseous lesions. No abnormal marrow edema or enhancement. Cord: Now evident is patchy signal abnormality involving the distal cord/conus medullaris at the level of T12-L1 (series 12, image 8). No appreciable associated enhancement. This appears to be new and/or at least now visible since prior MRI from 07/19/2022. A shin remainder of the thoracic cord remains otherwise normal in appearance. Paraspinal and other soft tissues: Paraspinous soft tissues demonstrate no acute finding. Small simple cyst noted within the left kidney, benign in appearance, no follow-up imaging recommended. Disc levels: Previously identified small disc protrusions at T5-6 and T8-9 without significant stenosis or impingement, stable. No other new or significant disc pathology. No stenosis or impingement. MRI LUMBAR SPINE FINDINGS Segmentation: Standard. Lowest well-formed  disc space labeled the L5-S1 level. Alignment: Trace facet mediated anterolisthesis of L3 on L4 and L4 on L5. Vertebrae: Vertebral body height maintained without acute or interval fracture. Bone marrow signal intensity heterogeneous but overall within normal limits. Benign hemangioma noted within the S1 vertebral body. No worrisome osseous lesions. No other significant abnormal marrow edema or enhancement. Conus medullaris: Extends to the L1 level and appears normal. Patchy signal abnormality involving the distal cord/conus  medullaris (series 19, image 8). No discernible associated enhancement. Nerve roots of the cauda equina and themselves relatively normal in appearance without significant nerve root thickening or enhancement. Paraspinal and other soft tissues: Paraspinous soft tissues demonstrate no acute finding. Probable fibroid uterus noted. 1.7 cm soft tissue density protrudes into the endometrial cavity, possibly a submucosal fibroid, but indeterminate (series 19, image 7). Disc levels: Underlying degenerative disc disease and facet hypertrophy, not significantly changed from recent MRI from 07/29/2022, fully described on that examination. IMPRESSION: 1. Patchy signal abnormality involving the distal cord/conus medullaris at the level of T12-L1, new since prior MRI from 07/29/2022. Findings are nonspecific, but could reflect changes of transverse myelitis, which could be either infectious or inflammatory in nature. A demyelinating process could also be considered. No associated enhancement. No visible involvement of the nerve roots of the cauda equina. 2. Otherwise stable MRI of the thoracic and lumbar spine with underlying degenerative spondylosis, fully described or recent MRI from 07/29/2022. 3. Fibroid uterus. 1.7 cm soft tissue lesion protrudes into the endometrial cavity, possibly a submucosal fibroid, but incompletely assessed on this exam. Further evaluation with dedicated pelvic ultrasound recommended. Electronically Signed   By: Jeannine Boga M.D.   On: 07/31/2022 19:46   MR Cervical Spine W or Wo Contrast  Result Date: 07/31/2022 CLINICAL DATA:  Initial evaluation for sensation loss in bilateral lower extremities. EXAM: MRI CERVICAL SPINE WITHOUT AND WITH CONTRAST TECHNIQUE: Multiplanar and multiecho pulse sequences of the cervical spine, to include the craniocervical junction and cervicothoracic junction, were obtained without and with intravenous contrast. CONTRAST:  84mL GADAVIST GADOBUTROL 1 MMOL/ML IV  SOLN COMPARISON:  None Available. FINDINGS: Alignment: Examination degraded by motion. Straightening of the normal cervical lordosis.  No listhesis. Vertebrae: Vertebral body height maintained without acute or chronic fracture. Bone marrow signal intensity within normal limits. No discrete or worrisome osseous lesions. No abnormal marrow edema or enhancement. Cord: Normal signal and morphology.  No abnormal enhancement. Posterior Fossa, vertebral arteries, paraspinal tissues: Small bilateral mastoid effusions partially visualized. Otherwise unremarkable. Disc levels: C2-C3: Right eccentric disc osteophyte complex with bilateral uncovertebral spurring. No significant spinal stenosis. Foramina remain adequately patent. C3-C4: Diffuse disc bulge with bilateral uncovertebral spurring, asymmetric to the right. Flattening and partial effacement of the ventral thecal sac. Mild spinal stenosis with moderate bilateral C4 foraminal narrowing. C4-C5: Degenerative intervertebral disc space narrowing with diffuse disc osteophyte complex. Flattening and partial effacement of the ventral thecal sac with resultant mild spinal stenosis. Severe right with moderate left C5 foraminal stenosis. C5-C6: Degenerative intervertebral disc space narrowing with diffuse disc osteophyte complex. Flattening and partial effacement of the ventral thecal sac. Mild spinal stenosis. Severe right with moderate left C6 foraminal narrowing. C6-C7:  Unremarkable. C7-T1: Negative interspace. Mild left-sided facet hypertrophy. No stenosis. IMPRESSION: 1. Normal MRI appearance of the cervical spinal cord. 2. Multilevel cervical spondylosis with resultant mild spinal stenosis at C3-4 through C5-6. 3. Multifactorial degenerative changes with resultant multilevel foraminal narrowing as above. Notable findings include moderate bilateral C4 foraminal stenosis, with severe right and  moderate left C5 and C6 foraminal narrowing. Electronically Signed   By: Rise Mu M.D.   On: 07/31/2022 18:54      Scheduled Meds: Continuous Infusions:   LOS: 1 day     Noralee Stain, DO [facility] 08/01/2022, 1:15 PM   Available via Epic secure chat 7am-7pm After these hours, please refer to coverage provider listed on [email or web address]

## 2022-08-01 NOTE — Progress Notes (Cosign Needed Addendum)
Neurology Progress Note  Brief HPI:  46 y.o. female past medical history of chronic lower back pain presenting to the emergency room for evaluation of lower extremity numbness and weakness. She reports that she had a URI last week but had a trip planned to the [place] which she went down at the end of last week and returned back on Sunday.  She was not 100% normal-had congestion in her sinuses and felt somewhat sick with upper respiratory symptoms but was able to work.  Monday was fine.  Tuesday morning she started noticing burning sensation in her feet as well as a sensation of muscle spasm in her legs.  She was also having urinary retention. She went to the emergency department at [facility] had an MRI of the lumbar spine as well as thoracic spine-07/29/2022-with no acute issues.  Was given a dose of steroids and discharged home.  Her symptoms continued to worsen to the point that she was not able to walk normally and was also having urinary retention that was worsening, which made her come to the emergency room at [facility]. She also recently completed a course of antibiotics for trichomonas   Addendum- no sign of infection in UA or CXR- IV solumedrol 1g x5 days ordered  Subjective: Patient seen in room. She has been able to stand and pivot to the bedside commode, but has not walked. Her strength is better in her left leg than her right.   Exam: Vitals:   08/01/22 0030 08/01/22 0645  BP: 114/71 114/85  Pulse: 65 63  Resp: 16 15  Temp: 98.1 F (36.7 C) 98.7 F (37.1 C)  SpO2: 96% 99%   Gen: In bed, NAD Resp: non-labored breathing, no acute distress Abd: soft, nt  Neuro: Mental Status: AAox4. Speech is clear. Patient is able to give a clear and coherent history. No signs of aphasia or neglect Cranial Nerves: II: Visual Fields are full. PERRL.   III,IV, VI: EOMI without ptosis or diploplia.  V: Facial sensation is symmetric to temperature VII: Facial  movement is symmetric resting and smiling VIII: Hearing is intact to voice X: Palate elevates symmetrically XI: Shoulder shrug is symmetric. XII: Tongue protrudes midline without atrophy or fasciculations.  Motor: Tone is normal. Bulk is normal. 5/5 strength in bilateral upper extremities without drift.  There is no drift in the lower extremities but the right plantar and dorsiflexion is 3/5, left plantar and dorsiflexion is 4/5.  Hip and knee flexors bilaterally are 4+/5. Unable rotate at the right ankle, slow rotation of left ankle.  Sensory: Diminished bilaterally on the lateral thighs but no saddle anesthesia.  Reports subjective numbness on her vaginal area, but not in the anal area. DTRs:  2+ in upper extremities, 2-3+ knee jerks bilaterally, 2+ ankle jerks bilaterally,.  Upon eliciting plantar reflex, she has some withdrawal but no definitive upgoing toe. Cerebellar: FNF and HKS are intact bilaterally, rapid alternating movements Gait: steady or deferred for safety  Pertinent Labs: CSF: Clear, tube #1 and tube #4-WBCs 1 and 3, RBC 0 and 82.  Glucose 59, protein 29  RPR: Non reactive HIV: Non reactive ANA w/ reflex- pending ANCA titers- pending  HTLV: pending HSV: pending VZV: pending EBV: pending [place] Nile: pending Lyme: pending Anti myelin assoc - pending Neuromyelitis- pending    Imaging Reviewed: MRI of the thoracic and lumbar spine repeated which shows patchy signal abnormality involving the distal cord/conus medullaris at the level of T12/L1 which is  new since prior MRI from 07/29/2022.   Assessment: 46 year old with history of chronic low back pain presented to the emergency room for evaluation of lower extremity burning followed by numbness and weakness as well as urinary retention since this past Tuesday. She had a respiratory infection last week, traveled to the [place] on a planned vacation, continued to feel somewhat upper respiratory symptoms but not until  Tuesday that she noted difficulty walking which started after burning sensation and a sensation of muscle spasms in her legs. - Spinal cord imaging shows T12-L1 signal abnormality without enhancement. - Has a history of STD recently. With a history of travel and STD, differentials are broad and include infectious versus postinfectious etiology versus inflammatory etiology. - CSF studies are not very suggestive of inflammatory or infectious etiology but this could be very early course of transverse myelitis when enhancement and inflammatory markers are yet not evident.  Also, if she has HIV or is immunocompromised, she might not mount adequate responses. - Irrespective, she needs a thorough work-up for this lower spinal cord transverse myelitis with broad differential that should still include infectious versus inflammatory etiology. - Clinical picture, imaging with no enhancement of the nerve roots and CSF findings do not favor Guillain-Barr. - Lower thoracic/upper lumbar spinal cord transverse myelitis-differentials broad and include autoimmune processes, infectious processes, postinfectious processes. - Case reports of anti-MOG antibodies associated disease with conus medullaris syndrome have also been reported hence we will check the antibodies as well as check for neuromyelitis optica as well.  Viral infectious etiology will also be checked.  May need ID involvement for suggestions on other infectious causes based on travel and STD history  Recommendations: Start Solu-Medrol 1 g IV daily for 5 days if UA and Chest xray are negative. I would also check IgG index, oligoclonal bands and anti-NMO antibodies as well as anti-MOG antibodies. She has a recent history of STD.  We will also check RPR, HIV and HTLV. For other etiologies that can involve the spinal cord, I would recommend checking angiotensin-converting enzyme in serum and CSF, antinuclear antibodies, ANCA's.  I would also add HSV, VZV, EBV as  well as [place] Nile virus and Lyme serology Given recent travel to the [place], other than HTLV, HIV and HSV VZV EBV as well as [place] Nile-I would run this case with ID in the morning to see if they have any other recommendations.  There is no abnormal enhancement which makes schistosomiasis less likely but with her travel history, exotic infections not common in our geographic area should also be considered.  Morning team to reach out to ID.   Patient seen and examined by NP/APP with MD.  Janine Ores, DNP, FNP-BC [facility] Pager: [phone]  Electronically signed: Dr. Kerney Elbe'

## 2022-08-01 NOTE — ED Notes (Signed)
Patient awake and alert, resting in bed, no s/s of distress, did state she has some provider questions, Choi, DO, notified, will continue to monitor.

## 2022-08-01 NOTE — Progress Notes (Signed)
  Transition of Care [facility]) Screening Note   Patient Details  Name: Kimberly Briggs Date of Birth: 1975-10-29   Transition of Care [facility]) CM/SW Contact:    Pollie Friar, RN Phone Number: 08/01/2022, 3:47 PM   Pt is from home with spouse. Plan is for 5 days of IV solumedrol. CM will see next week Transition of Care Department [facility]) has reviewed patient. We will continue to monitor patient advancement through interdisciplinary progression rounds. If new patient transition needs arise, please place a TOC consult.

## 2022-08-02 DIAGNOSIS — R29898 Other symptoms and signs involving the musculoskeletal system: Secondary | ICD-10-CM | POA: Diagnosis not present

## 2022-08-02 LAB — ANGIOTENSIN CONVERTING ENZYME: Angiotensin-Converting Enzyme: 14 U/L (ref 14–82)

## 2022-08-02 LAB — ANA W/REFLEX IF POSITIVE: Anti Nuclear Antibody (ANA): NEGATIVE

## 2022-08-02 MED ORDER — SENNOSIDES-DOCUSATE SODIUM 8.6-50 MG PO TABS
1.0000 | ORAL_TABLET | Freq: Two times a day (BID) | ORAL | Status: DC
  Administered 2022-08-02 – 2022-08-05 (×7): 1 via ORAL
  Filled 2022-08-02 (×7): qty 1

## 2022-08-02 NOTE — Evaluation (Signed)
Physical Therapy Evaluation Patient Details Name: Kimberly Briggs MRN: [id number] DOB: Jan 29, 1976 Today's Date: 08/02/2022  History of Present Illness  46 y.o. female with medical history significant for osteoarthritis involving the bilateral lower extremities who is admitted to [facility] on 07/31/2022 with weakness of the bilateral lower extremities, question transverse myelitis.  Clinical Impression  Pt admitted with above diagnosis. Ambulates with RW, demonstrating Rt foot drop but compensating well. Attempted reduced support with walking and requires physical assist (minA) for balance. BERG balance test completed with high fall risk indicated 19/56. Educated on safety, AD use, and progression of PT during admission. Has assistance at home from family for a while but she is the caregiver for her adult son. Pt currently with functional limitations due to the deficits listed below (see PT Problem List). Pt will benefit from skilled PT to increase their independence and safety with mobility to allow discharge to the venue listed below.          Recommendations for follow up therapy are one component of a multi-disciplinary discharge planning process, led by the attending physician.  Recommendations may be updated based on patient status, additional functional criteria and insurance authorization.  Follow Up Recommendations Outpatient PT      Assistance Recommended at Discharge Intermittent Supervision/Assistance  Patient can return home with the following  A little help with walking and/or transfers;A little help with bathing/dressing/bathroom;Assistance with cooking/housework;Assist for transportation;Help with stairs or ramp for entrance    Equipment Recommendations  (TBD, pending progress during admission - possible RW and 3 in 1)  Recommendations for Other Services       Functional Status Assessment Patient has had a recent decline in their functional status and demonstrates the  ability to make significant improvements in function in a reasonable and predictable amount of time.     Precautions / Restrictions Precautions Precautions: Fall Precaution Comments: Rt foot drop Restrictions Weight Bearing Restrictions: No      Mobility  Bed Mobility Overal bed mobility: Independent                  Transfers Overall transfer level: Needs assistance Equipment used: Rolling walker (2 wheels) Transfers: Sit to/from Stand Sit to Stand: Supervision           General transfer comment: supervision for safety, cues for RW use upon standing, back of legs agains bed to stabilize.    Ambulation/Gait Ambulation/Gait assistance: Min assist Gait Distance (Feet): 225 Feet Assistive device: Rolling walker (2 wheels), None Gait Pattern/deviations: Step-through pattern, Decreased stride length, Decreased dorsiflexion - right, Decreased dorsiflexion - left Gait velocity: slow Gait velocity interpretation: <1.8 ft/sec, indicate of risk for recurrent falls (with RW)   General Gait Details: Educated on safe AD use with RW, compensating well for Rt drop foot, very mild noted with Lt, lacking normal heel strike however 5/5 MMT on Lt today with ankle DF and PF. Progressed to walking in hall with use of hand rail on Lt, moderate reliance with cues for symmetry of gait, CGA. Attempted 10 feet without AD use (rail on Lt for safety, PT guarding Rt side,) required min assist for LOB towards Lt and posterior.  Stairs            Wheelchair Mobility    Modified Rankin (Stroke Patients Only)       Balance Overall balance assessment: Needs assistance Sitting-balance support: No upper extremity supported, Feet supported Sitting balance-Leahy Scale: Normal     Standing balance support: No  upper extremity supported, During functional activity Standing balance-Leahy Scale: Fair                   Standardized Balance Assessment Standardized Balance Assessment  : Berg Balance Test Berg Balance Test Sit to Stand: Able to stand  independently using hands Standing Unsupported: Able to stand 2 minutes with supervision Sitting with Back Unsupported but Feet Supported on Floor or Stool: Able to sit safely and securely 2 minutes Stand to Sit: Controls descent by using hands Transfers: Needs one person to assist (supervision with RW) Standing Unsupported with Eyes Closed: Able to stand 3 seconds Standing Ubsupported with Feet Together: Needs help to attain position and unable to hold for 15 seconds From Standing, Reach Forward with Outstretched Arm: Reaches forward but needs supervision From Standing Position, Pick up Object from Floor: Unable to try/needs assist to keep balance From Standing Position, Turn to Look Behind Over each Shoulder: Needs supervision when turning Turn 360 Degrees: Needs assistance while turning Standing Unsupported, Alternately Place Feet on Step/Stool: Able to complete >2 steps/needs minimal assist Standing Unsupported, One Foot in Front: Loses balance while stepping or standing Standing on One Leg: Unable to try or needs assist to prevent fall Total Score: 19         Pertinent Vitals/Pain Pain Assessment Pain Assessment: No/denies pain (numbness)    Home Living Family/patient expects to be discharged to:: Private residence Living Arrangements: Children (She is the sole caregiver for her disabled son.) Available Help at Discharge: Family;Available 24 hours/day ((Temporarily)) Type of Home: Apartment Home Access: Stairs to enter Entrance Stairs-Rails: Left Entrance Stairs-Number of Steps: 4   Home Layout: One level Home Equipment: Cane - single point;Grab bars - tub/shower      Prior Function Prior Level of Function : Independent/Modified Independent;Working/employed;Driving             Mobility Comments: ind works as Lawyer part time, 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 LLE Sensation: decreased light touch;decreased proprioception       Communication   Communication: No difficulties  Cognition Arousal/Alertness: Awake/alert Behavior During Therapy: WFL for tasks assessed/performed Overall Cognitive Status: Within Functional Limits for tasks assessed                                          General Comments General comments (skin integrity, edema, etc.): safety education    Exercises     Assessment/Plan    PT Assessment Patient needs continued PT services  PT Problem List Decreased strength;Decreased range of motion;Decreased activity tolerance;Decreased balance;Decreased mobility;Decreased coordination;Decreased knowledge of use of DME;Impaired sensation       PT Treatment Interventions DME instruction;Gait training;Stair training;Functional mobility training;Therapeutic activities;Therapeutic exercise;Balance training;Neuromuscular re-education;Patient/family education    PT Goals (Current goals can be found in the Care Plan section)  Acute Rehab PT Goals Patient Stated Goal: Get well, go home PT Goal Formulation: With patient Time For Goal Achievement: 08/16/22 Potential to Achieve Goals: Good  Frequency Min 4X/week     Co-evaluation               AM-PAC PT "6 Clicks" Mobility  Outcome Measure Help needed turning from your back to your side while in a flat bed without using bedrails?: None Help needed moving from lying on your back to sitting on the side of a flat bed without using bedrails?: None Help needed moving  to and from a bed to a chair (including a wheelchair)?: A Little Help needed standing up from a chair using your arms (e.g., wheelchair or bedside chair)?: A Little Help needed to walk in hospital room?: A Little Help needed climbing 3-5 steps with a railing? : A Lot 6 Click Score: 19    End of Session Equipment Utilized During Treatment: Gait belt Activity Tolerance: Patient tolerated treatment well Patient left: in bed;with call bell/phone within reach;with bed alarm set;with nursing/sitter in room Nurse Communication: Mobility status PT Visit Diagnosis: Unsteadiness on feet (R26.81);Other abnormalities of gait and mobility (R26.89);Muscle weakness (generalized) (M62.81);Difficulty in walking, not elsewhere classified (R26.2);Other symptoms and signs involving the nervous system (R29.898)    Time: 8676-1950 PT Time Calculation (min) (ACUTE ONLY): 24 min   Charges:   PT Evaluation $PT Eval Low Complexity: 1 Low PT Treatments $Gait Training: 8-22 mins        Kathlyn Sacramento, PT, DPT Physical Therapist Acute Rehabilitation Services [facility] Outpatient Rehabilitation Services [facility]   Berton Mount 08/02/2022, 12:45 PM

## 2022-08-02 NOTE — Progress Notes (Signed)
PROGRESS NOTE    Kimberly Briggs  [id number] DOB: 11-27-1975 DOA: 07/31/2022 PCP: Patient, No Pcp Per     Brief Narrative:  Kimberly Briggs is a 46 y.o. female with medical history significant for osteoarthritis involving the bilateral lower extremities who is admitted to [facility] on 07/31/2022 with weakness of the bilateral lower extremities. The patient reports new onset numbness and paresthesias involving, initially, the bilateral feet starting 3 days ago.  Since onset, she has noted ascension of these sensory changes into the proximal legs bilaterally and into the buttocks and vagina, but denies involvement of the anus.  Additionally, over the last 2 days, she has noted new onset weakness in the bilateral lower extremities, with the weakness in the right lower extremity greater than the left.   She initially presented with these complaints to [facility] emergency department, at which time MRI of the lumbar spine was completed. She was referred to outpatient spine specialist who did not have further recommendations, other than MRI with contrast. Patient decided to come to [facility] ED due to continued symptoms.   Neurology consulted.  Patient was started on IV Solu-Medrol for 5 days due to concern for transverse myelitis.  New events last 24 hours / Subjective: Continues to have weakness specifically in the right lower extremity  Assessment & Plan:   Principal Problem:   Lower extremity weakness Active Problems:   Leukocytosis   Paresthesias   Transverse myelitis (HCC)   Bilateral lower extremity weakness, question transverse myelitis -MRI: Patchy signal abnormality involving the distal cord/conus medullaris at the level of T12-L1 -Neurology following -Additional CSF/serum labs ordered -pending -UA and chest x-ray negative for acute infectious process -IV Solu-Medrol started 11/3-11/7 -PT eval     DVT prophylaxis:  SCDs Start: 07/31/22 2257  Code Status:  Full Family Communication: None at bedside Disposition Plan:  Status is: Inpatient Remains inpatient appropriate because: IV solumedrol   Consultants:  Neurology   Procedures:  None   Antimicrobials:  Anti-infectives (From admission, onward)    None        Objective: Vitals:   08/01/22 2037 08/01/22 2342 08/02/22 0422 08/02/22 0814  BP: (!) 131/91 131/79 (!) 144/93 127/85  Pulse: 78 71 67 74  Resp: 18 18 16    Temp: 97.9 F (36.6 C) 98.3 F (36.8 C) 98.1 F (36.7 C)   TempSrc: Oral Oral Oral   SpO2: 100% 100% 100% 99%  Weight:      Height:       No intake or output data in the 24 hours ending 08/02/22 1145 Filed Weights   07/31/22 1221  Weight: 100.7 kg    Examination: General exam: Appears calm and comfortable  Respiratory system: Clear to auscultation. Respiratory effort normal. No respiratory distress. No conversational dyspnea.  Cardiovascular system: S1 & S2 heard, RRR. No murmurs. No pedal edema. Gastrointestinal system: Abdomen is nondistended, soft and nontender. Normal bowel sounds heard. Central nervous system: Alert and oriented. Speech clear. +RLE weaker compared to LLE  Extremities: Symmetric in appearance  Skin: No rashes, lesions or ulcers on exposed skin  Psychiatry: Judgement and insight appear normal. Mood & affect appropriate.   Data Reviewed: I have personally reviewed following labs and imaging studies  CBC: Recent Labs  Lab 07/31/22 1257 08/01/22 0512  WBC 13.5* 8.7  NEUTROABS 10.3* 4.5  HGB 13.1 11.9*  HCT 39.7 38.1  MCV 90.0 92.3  PLT 388 [id number]    Basic Metabolic Panel: Recent Labs  Lab 07/31/22  1257 08/01/22 0512  NA 141 138  K 4.2 3.5  CL 104 106  CO2 23 24  GLUCOSE 97 110*  BUN 14 17  CREATININE 0.72 0.85  CALCIUM 9.7 8.8*  MG  --  2.1    GFR: Estimated Creatinine Clearance: 100.4 mL/min (by C-G formula based on SCr of 0.85 mg/dL). Liver Function Tests: Recent Labs  Lab 07/31/22 1257 08/01/22 0512  AST  21 20  ALT 13 13  ALKPHOS 61 57  BILITOT 0.3 0.6  PROT 7.5 6.6  ALBUMIN 3.6 3.1*    No results for input(s): "LIPASE", "AMYLASE" in the last 168 hours. No results for input(s): "AMMONIA" in the last 168 hours. Coagulation Profile: No results for input(s): "INR", "PROTIME" in the last 168 hours. Cardiac Enzymes: Recent Labs  Lab 07/31/22 1257  CKTOTAL 362*    BNP (last 3 results) No results for input(s): "PROBNP" in the last 8760 hours. HbA1C: No results for input(s): "HGBA1C" in the last 72 hours. CBG: No results for input(s): "GLUCAP" in the last 168 hours. Lipid Profile: No results for input(s): "CHOL", "HDL", "LDLCALC", "TRIG", "CHOLHDL", "LDLDIRECT" in the last 72 hours. Thyroid Function Tests: Recent Labs    08/01/22 0511  TSH 2.723    Anemia Panel: Recent Labs    07/31/22 1439  VITAMINB12 521    Sepsis Labs: No results for input(s): "PROCALCITON", "LATICACIDVEN" in the last 168 hours.  Recent Results (from the past 240 hour(s))  CSF culture w Gram Stain     Status: None (Preliminary result)   Collection Time: 07/31/22  3:50 PM   Specimen: CSF; Cerebrospinal Fluid  Result Value Ref Range Status   Specimen Description CSF  Final   Special Requests NONE  Final   Gram Stain NO WBC SEEN NO ORGANISMS SEEN CYTOSPIN SMEAR   Final   Culture   Final    NO GROWTH 2 DAYS Performed at [facility], [address] [place], [place] [address]    Report Status PENDING  Incomplete      Radiology Studies: DG Chest Port 1 View  Result Date: 08/01/2022 CLINICAL DATA:  Leukocytosis EXAM: PORTABLE CHEST 1 VIEW COMPARISON:  None Available. FINDINGS: The heart size and mediastinal contours are within normal limits. Both lungs are clear. The visualized skeletal structures are unremarkable. IMPRESSION: Negative Electronically Signed   By: Rolm Baptise M.D.   On: 08/01/2022 02:13   MR Lumbar Spine W Wo Contrast  Result Date: 07/31/2022 CLINICAL DATA:  Initial  evaluation for loss of sensation in bilateral lower extremities, trunk numbness, tingling. EXAM: MRI THORACIC AND LUMBAR SPINE WITHOUT AND WITH CONTRAST TECHNIQUE: Multiplanar and multiecho pulse sequences of the thoracic and lumbar spine were obtained without and with intravenous contrast. CONTRAST:  84mL GADAVIST GADOBUTROL 1 MMOL/ML IV SOLN COMPARISON:  Comparison made with recent MRI from 07/29/2022. FINDINGS: MRI THORACIC SPINE FINDINGS Alignment: Physiologic with preservation of the normal thoracic kyphosis. No listhesis. Vertebrae: Vertebral body height maintained without acute or interval fracture. Bone marrow signal intensity within normal limits. No discrete or worrisome osseous lesions. No abnormal marrow edema or enhancement. Cord: Now evident is patchy signal abnormality involving the distal cord/conus medullaris at the level of T12-L1 (series 12, image 8). No appreciable associated enhancement. This appears to be new and/or at least now visible since prior MRI from 07/19/2022. A shin remainder of the thoracic cord remains otherwise normal in appearance. Paraspinal and other soft tissues: Paraspinous soft tissues demonstrate no acute finding. Small  simple cyst noted within the left kidney, benign in appearance, no follow-up imaging recommended. Disc levels: Previously identified small disc protrusions at T5-6 and T8-9 without significant stenosis or impingement, stable. No other new or significant disc pathology. No stenosis or impingement. MRI LUMBAR SPINE FINDINGS Segmentation: Standard. Lowest well-formed disc space labeled the L5-S1 level. Alignment: Trace facet mediated anterolisthesis of L3 on L4 and L4 on L5. Vertebrae: Vertebral body height maintained without acute or interval fracture. Bone marrow signal intensity heterogeneous but overall within normal limits. Benign hemangioma noted within the S1 vertebral body. No worrisome osseous lesions. No other significant abnormal marrow edema or  enhancement. Conus medullaris: Extends to the L1 level and appears normal. Patchy signal abnormality involving the distal cord/conus medullaris (series 19, image 8). No discernible associated enhancement. Nerve roots of the cauda equina and themselves relatively normal in appearance without significant nerve root thickening or enhancement. Paraspinal and other soft tissues: Paraspinous soft tissues demonstrate no acute finding. Probable fibroid uterus noted. 1.7 cm soft tissue density protrudes into the endometrial cavity, possibly a submucosal fibroid, but indeterminate (series 19, image 7). Disc levels: Underlying degenerative disc disease and facet hypertrophy, not significantly changed from recent MRI from 07/29/2022, fully described on that examination. IMPRESSION: 1. Patchy signal abnormality involving the distal cord/conus medullaris at the level of T12-L1, new since prior MRI from 07/29/2022. Findings are nonspecific, but could reflect changes of transverse myelitis, which could be either infectious or inflammatory in nature. A demyelinating process could also be considered. No associated enhancement. No visible involvement of the nerve roots of the cauda equina. 2. Otherwise stable MRI of the thoracic and lumbar spine with underlying degenerative spondylosis, fully described or recent MRI from 07/29/2022. 3. Fibroid uterus. 1.7 cm soft tissue lesion protrudes into the endometrial cavity, possibly a submucosal fibroid, but incompletely assessed on this exam. Further evaluation with dedicated pelvic ultrasound recommended. Electronically Signed   By: Jeannine Boga M.D.   On: 07/31/2022 19:46   MR THORACIC SPINE W WO CONTRAST  Result Date: 07/31/2022 CLINICAL DATA:  Initial evaluation for loss of sensation in bilateral lower extremities, trunk numbness, tingling. EXAM: MRI THORACIC AND LUMBAR SPINE WITHOUT AND WITH CONTRAST TECHNIQUE: Multiplanar and multiecho pulse sequences of the thoracic and  lumbar spine were obtained without and with intravenous contrast. CONTRAST:  98mL GADAVIST GADOBUTROL 1 MMOL/ML IV SOLN COMPARISON:  Comparison made with recent MRI from 07/29/2022. FINDINGS: MRI THORACIC SPINE FINDINGS Alignment: Physiologic with preservation of the normal thoracic kyphosis. No listhesis. Vertebrae: Vertebral body height maintained without acute or interval fracture. Bone marrow signal intensity within normal limits. No discrete or worrisome osseous lesions. No abnormal marrow edema or enhancement. Cord: Now evident is patchy signal abnormality involving the distal cord/conus medullaris at the level of T12-L1 (series 12, image 8). No appreciable associated enhancement. This appears to be new and/or at least now visible since prior MRI from 07/19/2022. A shin remainder of the thoracic cord remains otherwise normal in appearance. Paraspinal and other soft tissues: Paraspinous soft tissues demonstrate no acute finding. Small simple cyst noted within the left kidney, benign in appearance, no follow-up imaging recommended. Disc levels: Previously identified small disc protrusions at T5-6 and T8-9 without significant stenosis or impingement, stable. No other new or significant disc pathology. No stenosis or impingement. MRI LUMBAR SPINE FINDINGS Segmentation: Standard. Lowest well-formed disc space labeled the L5-S1 level. Alignment: Trace facet mediated anterolisthesis of L3 on L4 and L4 on L5. Vertebrae: Vertebral body height maintained without  acute or interval fracture. Bone marrow signal intensity heterogeneous but overall within normal limits. Benign hemangioma noted within the S1 vertebral body. No worrisome osseous lesions. No other significant abnormal marrow edema or enhancement. Conus medullaris: Extends to the L1 level and appears normal. Patchy signal abnormality involving the distal cord/conus medullaris (series 19, image 8). No discernible associated enhancement. Nerve roots of the cauda  equina and themselves relatively normal in appearance without significant nerve root thickening or enhancement. Paraspinal and other soft tissues: Paraspinous soft tissues demonstrate no acute finding. Probable fibroid uterus noted. 1.7 cm soft tissue density protrudes into the endometrial cavity, possibly a submucosal fibroid, but indeterminate (series 19, image 7). Disc levels: Underlying degenerative disc disease and facet hypertrophy, not significantly changed from recent MRI from 07/29/2022, fully described on that examination. IMPRESSION: 1. Patchy signal abnormality involving the distal cord/conus medullaris at the level of T12-L1, new since prior MRI from 07/29/2022. Findings are nonspecific, but could reflect changes of transverse myelitis, which could be either infectious or inflammatory in nature. A demyelinating process could also be considered. No associated enhancement. No visible involvement of the nerve roots of the cauda equina. 2. Otherwise stable MRI of the thoracic and lumbar spine with underlying degenerative spondylosis, fully described or recent MRI from 07/29/2022. 3. Fibroid uterus. 1.7 cm soft tissue lesion protrudes into the endometrial cavity, possibly a submucosal fibroid, but incompletely assessed on this exam. Further evaluation with dedicated pelvic ultrasound recommended. Electronically Signed   By: Jeannine Boga M.D.   On: 07/31/2022 19:46   MR Cervical Spine W or Wo Contrast  Result Date: 07/31/2022 CLINICAL DATA:  Initial evaluation for sensation loss in bilateral lower extremities. EXAM: MRI CERVICAL SPINE WITHOUT AND WITH CONTRAST TECHNIQUE: Multiplanar and multiecho pulse sequences of the cervical spine, to include the craniocervical junction and cervicothoracic junction, were obtained without and with intravenous contrast. CONTRAST:  52mL GADAVIST GADOBUTROL 1 MMOL/ML IV SOLN COMPARISON:  None Available. FINDINGS: Alignment: Examination degraded by motion.  Straightening of the normal cervical lordosis.  No listhesis. Vertebrae: Vertebral body height maintained without acute or chronic fracture. Bone marrow signal intensity within normal limits. No discrete or worrisome osseous lesions. No abnormal marrow edema or enhancement. Cord: Normal signal and morphology.  No abnormal enhancement. Posterior Fossa, vertebral arteries, paraspinal tissues: Small bilateral mastoid effusions partially visualized. Otherwise unremarkable. Disc levels: C2-C3: Right eccentric disc osteophyte complex with bilateral uncovertebral spurring. No significant spinal stenosis. Foramina remain adequately patent. C3-C4: Diffuse disc bulge with bilateral uncovertebral spurring, asymmetric to the right. Flattening and partial effacement of the ventral thecal sac. Mild spinal stenosis with moderate bilateral C4 foraminal narrowing. C4-C5: Degenerative intervertebral disc space narrowing with diffuse disc osteophyte complex. Flattening and partial effacement of the ventral thecal sac with resultant mild spinal stenosis. Severe right with moderate left C5 foraminal stenosis. C5-C6: Degenerative intervertebral disc space narrowing with diffuse disc osteophyte complex. Flattening and partial effacement of the ventral thecal sac. Mild spinal stenosis. Severe right with moderate left C6 foraminal narrowing. C6-C7:  Unremarkable. C7-T1: Negative interspace. Mild left-sided facet hypertrophy. No stenosis. IMPRESSION: 1. Normal MRI appearance of the cervical spinal cord. 2. Multilevel cervical spondylosis with resultant mild spinal stenosis at C3-4 through C5-6. 3. Multifactorial degenerative changes with resultant multilevel foraminal narrowing as above. Notable findings include moderate bilateral C4 foraminal stenosis, with severe right and moderate left C5 and C6 foraminal narrowing. Electronically Signed   By: Jeannine Boga M.D.   On: 07/31/2022 18:54  Scheduled Meds: Continuous  Infusions:  methylPREDNISolone (SOLU-MEDROL) injection 1,000 mg (08/01/22 1602)     LOS: 2 days     Dessa Phi, DO [facility] 08/02/2022, 11:45 AM   Available via Epic secure chat 7am-7pm After these hours, please refer to coverage provider listed on [email or web address]

## 2022-08-03 DIAGNOSIS — R29898 Other symptoms and signs involving the musculoskeletal system: Secondary | ICD-10-CM | POA: Diagnosis not present

## 2022-08-03 DIAGNOSIS — G373 Acute transverse myelitis in demyelinating disease of central nervous system: Secondary | ICD-10-CM | POA: Diagnosis not present

## 2022-08-03 LAB — HTLV I+II ANTIBODIES, (EIA), BLD: HTLV I/II Ab: NEGATIVE

## 2022-08-03 MED ORDER — POLYETHYLENE GLYCOL 3350 17 G PO PACK
17.0000 g | PACK | Freq: Every day | ORAL | Status: DC
  Administered 2022-08-03 – 2022-08-05 (×3): 17 g via ORAL
  Filled 2022-08-03 (×3): qty 1

## 2022-08-03 MED ORDER — BUTALBITAL-APAP-CAFFEINE 50-325-40 MG PO TABS
1.0000 | ORAL_TABLET | Freq: Once | ORAL | Status: AC | PRN
  Administered 2022-08-03: 1 via ORAL
  Filled 2022-08-03: qty 1

## 2022-08-03 NOTE — Progress Notes (Addendum)
Neurology Progress Note  Brief HPI:  46 y.o. female past medical history of chronic lower back pain presenting to the emergency room for evaluation of lower extremity numbness and weakness. She reports that she had a URI last week but had a trip planned to the [place] which she went down at the end of last week and returned back on Sunday.  She was not 100% normal-had congestion in her sinuses and felt somewhat sick with upper respiratory symptoms but was able to work.  Monday was fine.  Tuesday morning she started noticing burning sensation in her feet as well as a sensation of muscle spasm in her legs.    Subjective: Patient seen in room. Been working with and walking with PT who are recommending outpatient.  States left leg feels 70% improved and right leg is at 45% improved. She has walked with a walker and with an assist of one. Still reporting some balance issues. Improved numbness bilaterally but does still notice it in the right more than the left.   Exam: Vitals:   08/03/22 0340 08/03/22 0754  BP: 112/69 135/81  Pulse: 61 64  Resp: 16 16  Temp: 97.9 F (36.6 C) 98.6 F (37 C)  SpO2: 100% 99%   Gen: In bed, NAD Resp: non-labored breathing, no acute distress Abd: soft, nt  Neuro: Mental Status: AAox4. Speech is clear. Patient is able to give a clear and coherent history. No signs of aphasia or neglect Cranial Nerves: II: Visual Fields are full. PERRL.   III,IV, VI: EOMI without ptosis or diploplia.  V: Facial sensation is symmetric to temperature VII: Facial movement is symmetric resting and smiling VIII: Hearing is intact to voice X: Palate elevates symmetrically XI: Shoulder shrug is symmetric. XII: Tongue protrudes midline without atrophy or fasciculations.  Motor: Tone is normal. Bulk is normal. 5/5 strength in bilateral upper extremities without drift.  There is no drift in the lower extremities but the right plantar and dorsiflexion is 3+/5, left plantar and  dorsiflexion is 4/5.  Hip and knee flexors bilaterally are 4+/5.  Minimal rotation at the right ankle, slow rotation of left ankle.  Sensory: Diminished bilaterally on the lateral thighs but no saddle anesthesia.  Reports subjective numbness on her vaginal area, but not in the anal area. DTRs:  2+ in upper extremities, 2+ knee jerks bilaterally, 2+ ankle jerks bilaterally,.  Upon eliciting plantar reflex, she has some withdrawal but no definitive upgoing toe. Cerebellar: FNF and HKS are intact bilaterally, rapid alternating movements Gait: Deferred for safety  Pertinent Labs: CSF: Clear, tube #1 and tube #4-WBCs 1 and 3, RBC 0 and 82.  Glucose 59, protein 29  RPR: Non reactive HIV: Non reactive ANA w/ reflex- Negative  ANCA titers- pending  HTLV 1+2: pending EBV: pending West Nile: pending Lyme: pending Anti myelin assoc - pending Neuromyelitis- pending  Anti-myelin accossiate gly- pending Meningitis/Encephalitis CSF Panel- negative HIV- Non reactive TSH- Negative ACE- 14 Tox screen- negative  Imaging Reviewed: MRI of the thoracic and lumbar spine repeated which shows patchy signal abnormality involving the distal cord/conus medullaris at the level of T12/L1 which is new since prior MRI from 07/29/2022.   Assessment: 45 year old with history of chronic low back pain presented to the emergency room for evaluation of lower extremity burning followed by numbness and weakness as well as urinary retention since this past Tuesday. She had a respiratory infection last week, traveled to the [place] on a planned vacation, continued to feel  somewhat upper respiratory symptoms but not until Tuesday that she noted difficulty walking which started after burning sensation and a sensation of muscle spasms in her legs.  - Spinal cord imaging shows T12-L1 signal abnormality without enhancement. - Has a history of STD recently. With a history of travel and STD, differentials are broad and  include infectious versus postinfectious etiology versus inflammatory etiology. - CSF studies are not very suggestive of inflammatory or infectious etiology but this could be very early course of transverse myelitis when enhancement and inflammatory markers are yet not evident.  Also, if she is immunocompromised, she might not mount adequate responses. - Irrespective, she needs a thorough work-up for this lower spinal cord transverse myelitis with broad differential that should still include infectious versus inflammatory etiology. - Clinical picture, imaging with no enhancement of the nerve roots and CSF findings do not favor Guillain-Barr. - Lower thoracic/upper lumbar spinal cord transverse myelitis-differentials broad and include autoimmune processes, infectious processes, postinfectious processes. - Case reports of anti-MOG antibodies associated disease with conus medullaris syndrome have also been reported hence we will check the antibodies as well as check for neuromyelitis optica as well.  Viral infectious etiology will also be checked.  May need ID involvement for suggestions on other infectious causes based on travel and STD history  Recommendations: On day 3/5 of solu-medrol 1g IV. Continue for a total of 5 days Continue to monitor for improvement with PT and OT.    Patient seen and examined by NP/APP.  Elmer Picker, DNP, FNP-BC [facility] Pager: [phone]  Electronically signed: Dr. Caryl Pina

## 2022-08-03 NOTE — Progress Notes (Signed)
PROGRESS NOTE    Kimberly Briggs  SWF:[id number] DOB: 1975/10/11 DOA: 07/31/2022 PCP: Patient, No Pcp Per     Brief Narrative:  Kimberly Briggs is a 46 y.o. female with medical history significant for osteoarthritis involving the bilateral lower extremities who is admitted to [facility] on 07/31/2022 with weakness of the bilateral lower extremities. The patient reports new onset numbness and paresthesias involving, initially, the bilateral feet starting 3 days ago.  Since onset, she has noted ascension of these sensory changes into the proximal legs bilaterally and into the buttocks and vagina, but denies involvement of the anus.  Additionally, over the last 2 days, she has noted new onset weakness in the bilateral lower extremities, with the weakness in the right lower extremity greater than the left.   She initially presented with these complaints to [facility] emergency department, at which time MRI of the lumbar spine was completed. She was referred to outpatient spine specialist who did not have further recommendations, other than MRI with contrast. Patient decided to come to [facility] ED due to continued symptoms.   Neurology consulted.  Patient was started on IV Solu-Medrol for 5 days due to concern for transverse myelitis.  New events last 24 hours / Subjective: States the numbness and weakness in her LE are improving. Has worked with PT and walked around room as well   Assessment & Plan:   Principal Problem:   Lower extremity weakness Active Problems:   Leukocytosis   Paresthesias   Transverse myelitis (HCC)   Bilateral lower extremity weakness, question transverse myelitis -MRI: Patchy signal abnormality involving the distal cord/conus medullaris at the level of T12-L1 -Neurology following -Additional CSF/serum labs ordered -pending -UA and chest x-ray negative for acute infectious process -IV Solu-Medrol started 11/3-11/7 -PT eval rec outpatient PT     DVT  prophylaxis:  SCDs Start: 07/31/22 2257  Code Status: Full Family Communication: None at bedside Disposition Plan:  Status is: Inpatient Remains inpatient appropriate because: IV solumedrol   Consultants:  Neurology   Procedures:  None   Antimicrobials:  Anti-infectives (From admission, onward)    None        Objective: Vitals:   08/03/22 0340 08/03/22 0500 08/03/22 0754 08/03/22 1224  BP: 112/69  135/81 (!) 155/78  Pulse: 61  64 60  Resp: 16  16   Temp: 97.9 F (36.6 C)  98.6 F (37 C) 97.9 F (36.6 C)  TempSrc: Oral   Oral  SpO2: 100%  99% 97%  Weight:  102.4 kg    Height:       No intake or output data in the 24 hours ending 08/03/22 1305 Filed Weights   07/31/22 1221 08/03/22 0500  Weight: 100.7 kg 102.4 kg    Examination: General exam: Appears calm and comfortable  Respiratory system: Clear to auscultation. Respiratory effort normal. No respiratory distress. No conversational dyspnea.  Cardiovascular system: S1 & S2 heard, RRR. No murmurs. No pedal edema. Gastrointestinal system: Abdomen is nondistended, soft and nontender. Normal bowel sounds heard. Central nervous system: Alert and oriented. Speech clear. Extremities: Symmetric in appearance  Skin: No rashes, lesions or ulcers on exposed skin  Psychiatry: Judgement and insight appear normal. Mood & affect appropriate.   Data Reviewed: I have personally reviewed following labs and imaging studies  CBC: Recent Labs  Lab 07/31/22 1257 08/01/22 0512  WBC 13.5* 8.7  NEUTROABS 10.3* 4.5  HGB 13.1 11.9*  HCT 39.7 38.1  MCV 90.0 92.3  PLT 388  312    Basic Metabolic Panel: Recent Labs  Lab 07/31/22 1257 08/01/22 0512  NA 141 138  K 4.2 3.5  CL 104 106  CO2 23 24  GLUCOSE 97 110*  BUN 14 17  CREATININE 0.72 0.85  CALCIUM 9.7 8.8*  MG  --  2.1    GFR: Estimated Creatinine Clearance: 101.3 mL/min (by C-G formula based on SCr of 0.85 mg/dL). Liver Function Tests: Recent Labs  Lab  07/31/22 1257 08/01/22 0512  AST 21 20  ALT 13 13  ALKPHOS 61 57  BILITOT 0.3 0.6  PROT 7.5 6.6  ALBUMIN 3.6 3.1*    No results for input(s): "LIPASE", "AMYLASE" in the last 168 hours. No results for input(s): "AMMONIA" in the last 168 hours. Coagulation Profile: No results for input(s): "INR", "PROTIME" in the last 168 hours. Cardiac Enzymes: Recent Labs  Lab 07/31/22 1257  CKTOTAL 362*    BNP (last 3 results) No results for input(s): "PROBNP" in the last 8760 hours. HbA1C: No results for input(s): "HGBA1C" in the last 72 hours. CBG: No results for input(s): "GLUCAP" in the last 168 hours. Lipid Profile: No results for input(s): "CHOL", "HDL", "LDLCALC", "TRIG", "CHOLHDL", "LDLDIRECT" in the last 72 hours. Thyroid Function Tests: Recent Labs    08/01/22 0511  TSH 2.723    Anemia Panel: Recent Labs    07/31/22 1439  VITAMINB12 521    Sepsis Labs: No results for input(s): "PROCALCITON", "LATICACIDVEN" in the last 168 hours.  Recent Results (from the past 240 hour(s))  CSF culture w Gram Stain     Status: None (Preliminary result)   Collection Time: 07/31/22  3:50 PM   Specimen: CSF; Cerebrospinal Fluid  Result Value Ref Range Status   Specimen Description CSF  Final   Special Requests NONE  Final   Gram Stain NO WBC SEEN NO ORGANISMS SEEN CYTOSPIN SMEAR   Final   Culture   Final    NO GROWTH 3 DAYS Performed at [facility], [address], [place], [place] [address]    Report Status PENDING  Incomplete      Radiology Studies: No results found.    Scheduled Meds:  polyethylene glycol  17 g Oral Daily   senna-docusate  1 tablet Oral BID   Continuous Infusions:  methylPREDNISolone (SOLU-MEDROL) injection 1,000 mg (08/02/22 1530)     LOS: 3 days     Noralee Stain, DO [facility] 08/03/2022, 1:05 PM   Available via Epic secure chat 7am-7pm After these hours, please refer to coverage provider listed on [email or web address]

## 2022-08-04 DIAGNOSIS — R29898 Other symptoms and signs involving the musculoskeletal system: Secondary | ICD-10-CM | POA: Diagnosis not present

## 2022-08-04 DIAGNOSIS — G373 Acute transverse myelitis in demyelinating disease of central nervous system: Secondary | ICD-10-CM | POA: Diagnosis not present

## 2022-08-04 LAB — LYME DISEASE SEROLOGY W/REFLEX: Lyme Total Antibody EIA: NEGATIVE

## 2022-08-04 LAB — CSF CULTURE W GRAM STAIN
Culture: NO GROWTH
Gram Stain: NONE SEEN

## 2022-08-04 LAB — NEUROMYELITIS OPTICA AUTOAB, IGG: NMO-IgG: <1.5 U/mL (ref 0.0–3.0)

## 2022-08-04 LAB — ANGIOTENSIN CONVERTING ENZYME, CSF: Angio Convert Enzyme: <1.5 U/L (ref 0.0–2.5)

## 2022-08-04 LAB — TROPONIN I (HIGH SENSITIVITY): Troponin I (High Sensitivity): 15 ng/L (ref <18)

## 2022-08-04 MED ORDER — BISACODYL 10 MG RE SUPP
10.0000 mg | Freq: Every day | RECTAL | Status: DC | PRN
  Administered 2022-08-04: 10 mg via RECTAL
  Filled 2022-08-04: qty 1

## 2022-08-04 MED ORDER — KETOROLAC TROMETHAMINE 15 MG/ML IJ SOLN
15.0000 mg | Freq: Four times a day (QID) | INTRAMUSCULAR | Status: DC | PRN
  Administered 2022-08-04 – 2022-08-05 (×2): 15 mg via INTRAVENOUS
  Filled 2022-08-04 (×2): qty 1

## 2022-08-04 MED ORDER — ZOLPIDEM TARTRATE 5 MG PO TABS: 5.0000 mg | ORAL_TABLET | Freq: Every evening | ORAL | Status: DC | PRN

## 2022-08-04 NOTE — Progress Notes (Signed)
Neurology Progress Note  Brief HPI:  46 y.o. female past medical history of chronic lower back pain presenting to the emergency room for evaluation of lower extremity numbness and weakness. She reports that she had a URI last week but had a trip planned to the [place] which she went down at the end of last week and returned back on Sunday.  She was not 100% normal-had congestion in her sinuses and felt somewhat sick with upper respiratory symptoms but was able to work.  Monday was fine.  Tuesday morning she started noticing burning sensation in her feet as well as a sensation of muscle spasm in her legs.  Patient reports that sensation ascended to include numbness of perineal area, states it was similar to when she had an epidural.  Subjective: Patient seen in room.  She states that she feels the steroids are helping, and states her right leg is 50% improved and left leg is about 70% improved.  She is looking forward to working with physical therapy, and hopes that she can walk more normally today.  Exam: Vitals:   08/04/22 0324 08/04/22 0854  BP: 127/67 137/85  Pulse: (!) 55 65  Resp: 12 17  Temp: 98.6 F (37 C) 98.3 F (36.8 C)  SpO2: 99% 100%   Gen: In bed, NAD Resp: non-labored breathing, no acute distress Abd: soft, nt  Neuro: Mental Status: AAox4. Speech is clear. Patient is able to give a clear and coherent history. No signs of aphasia or neglect Cranial Nerves: II: Visual Fields are full. PERRL.   III,IV, VI: EOMI without ptosis or diploplia.  V: Facial sensation is symmetric to light touch VII: Facial movement is symmetric resting and smiling VIII: Hearing is intact to voice X: Palate elevates symmetrically XI: Shoulder shrug is symmetric. XII: Tongue protrudes midline without atrophy or fasciculations.  Motor: Tone is normal. Bulk is normal. 5/5 strength in bilateral upper extremities without drift.  There is no drift in the lower extremities but the right  plantar and dorsiflexion is 4/5, left plantar and dorsiflexion is 4+/5.  Hip and knee flexors bilaterally are 4+/5.  Some, slow rotation at the right ankle, improved rotation of left ankle.  Sensory: Diminished bilaterally on the lateral thighs but no saddle anesthesia.  Reports subjective numbness on her vaginal area, but not in the anal area. DTRs:  2+ in upper extremities, 2+ knee jerks on right, but unable to elicit on left.   Gait: Deferred for safety  Pertinent Labs: CSF: Clear, tube #1 and tube #4-WBCs 1 and 3, RBC 0 and 82.  Glucose 59, protein 29  RPR: Non reactive HIV: Non reactive ANA w/ reflex- Negative  ANCA titers- pending  HTLV 1+2: Negative EBV: pending West Nile: pending Lyme: pending Anti myelin assoc - pending Neuromyelitis- pending  Anti-myelin accossiate gly- pending Meningitis/Encephalitis CSF Panel- negative HIV- Non reactive TSH- 2.273 ACE- 14 Tox screen- negative B12 521  Imaging Reviewed: MRI of the thoracic and lumbar spine repeated which shows patchy signal abnormality involving the distal cord/conus medullaris at the level of T12/L1 which is new since prior MRI from 07/29/2022.   Assessment: 46 year old with history of chronic low back pain presented to the emergency room for evaluation of lower extremity burning followed by numbness and weakness as well as urinary retention since this past Tuesday. She had a respiratory infection last week, traveled to the [place] on a planned vacation, continued to feel somewhat upper respiratory symptoms but not until Tuesday  that she noted difficulty walking which started after burning sensation and a sensation of muscle spasms in her legs.  - Spinal cord imaging shows T12-L1 signal abnormality without enhancement. - Has a history of STD (trichomonas) recently. With a history of travel and STD, differentials are broad and include infectious versus postinfectious etiology versus inflammatory etiology. - CSF  studies are not very suggestive of inflammatory or infectious etiology but this could be very early course of transverse myelitis when enhancement and inflammatory markers are yet not evident.  Also, if she is immunocompromised, she might not mount adequate responses. - Irrespective, she needs a thorough work-up for this lower spinal cord transverse myelitis with broad differential that should still include infectious versus inflammatory etiology. - Clinical picture, imaging with no enhancement of the nerve roots and CSF findings do not favor Guillain-Barr. - Lower thoracic/upper lumbar spinal cord transverse myelitis-differentials broad and include autoimmune processes, infectious processes, postinfectious processes. - Case reports of anti-MOG antibodies associated disease with conus medullaris syndrome have also been reported hence we will check the antibodies as well as check for neuromyelitis optica as well.  Viral infectious etiology will also be checked.  May need ID involvement for suggestions on other infectious causes based on travel and STD history  Recommendations: On day 4/5 of solu-medrol 1g IV. Continue for a total of 5 days Continue to monitor for improvement with PT and OT.    Patient seen and examined by NP/APP.  Cortney E Ernestina [place] , MSN, AGACNP-BC [facility] See Amion for schedule and pager information 08/04/2022 9:23 AM   NEUROHOSPITALIST ADDENDUM Performed a face to face diagnostic evaluation.   I have reviewed the contents of history and physical exam as documented by PA/ARNP/Resident and agree with above documentation.  I have discussed and formulated the above plan as documented. Edits to the note have been made as needed.  Impression/Key exam findings/Plan: Transverse myelitis of unclear etiology. CSF so far non revealing. Multiple labs pending or non revealing. Reports R leg is about 50% better and L leg is about 70% better after finishing 3 days of  steroids. No issues with urination since being on steroids. Very eager to work with PT/OT. She is to get her 4th dose todaty and her last dose of steroids tomorrow. She can be discharged tomorrow after the fifth dose of steroids. Will need outpatient PT and OT.  May need an AFO given she has   Erick Blinks, MD [facility] [phone]   If 7pm to 7am, please call on call as listed on AMION.

## 2022-08-04 NOTE — Progress Notes (Signed)
Physical Therapy Treatment Patient Details Name: Kimberly Briggs MRN: [id number] DOB: [birth year] Today's Date: 08/04/2022   History of Present Illness 46 y.o. female with medical history significant for osteoarthritis involving the bilateral lower extremities who is admitted to [facility] on 07/31/2022 with weakness of the bilateral lower extremities, question transverse myelitis.     PT Comments    Patient highly motivated to progress mobility and eager to work with therapy. Pt demonstrated good management of RW and progressed to min guard with RW for gait. Initiated stair training with bil UE support and pt steady throughout. Pt initiated gait with single crutch and mod assist at start with pt progressing to min assist for light balance stabilization. Step length increased as distance continued for more normalized gait. Pt demonstrated some toe extension but remains greatly limited with ankle dorsiflexion on Rt LE. Will continue to progress as able, continue to recommend OPPT with assist from family.    Recommendations for follow up therapy are one component of a multi-disciplinary discharge planning process, led by the attending physician.  Recommendations may be updated based on patient status, additional functional criteria and insurance authorization.  PT Recommendation   Follow Up Recommendations Outpatient PT Filed 08/02/2022 1200  Assistance recommended at discharge Intermittent Supervision/Assistance Filed 08/04/2022 5852  Patient can return home with the following A little help with walking and/or transfers, A little help with bathing/dressing/bathroom, Assistance with cooking/housework, Assist for transportation, Help with stairs or ramp for entrance [facility] 08/04/2022 0938  Functional Status Assessment Patient has had a recent decline in their functional status and demonstrates the ability to make significant improvements in function in a reasonable and predictable amount of time.  Filed 08/02/2022 1200  PT equipment Rolling walker (2 wheels), BSC/3in1  [TBD pending further progress] Filed 08/04/2022 0938    Mobility  Bed Mobility Overal bed mobility: Independent                  Transfers Overall transfer level: Needs assistance Equipment used: Rolling walker (2 wheels) Transfers: Sit to/from Stand Sit to Stand: Supervision           General transfer comment: supervision for safety.    Ambulation/Gait Ambulation/Gait assistance: Min guard, Min assist Gait Distance (Feet): 200 Feet Assistive device: Rolling walker (2 wheels), Crutches Gait Pattern/deviations: Decreased stride length, Decreased dorsiflexion - right, Step-through pattern, Step-to pattern Gait velocity: decr     General Gait Details: Pt steady with RW for support, no buckling or LOB noted. pt usign Rt hip flexor with poor eccentric control of Rt quad and hamstring during swing through phase, pt using momentum to fully step through and get Rt foot on floor. pt able to dorsiflex slightly but greatly limited on Rt. Ambulated short bout with single crutch on Lt UE, Mod assist at start 1x LOB requiring assist to correct. pt progressed to min assist with pt improving step to pattern of Rt foot with crutch.   Stairs Stairs: Yes Stairs assistance: Min guard Stair Management: One rail Right, Step to pattern, Forwards, With crutches Number of Stairs: 3 General stair comments: cues for sequencing "up with strong down with bad" no LOB and no buckling. pt using bil hand rail and crutch to simulate home set up.   Wheelchair Mobility    Modified Rankin (Stroke Patients Only)       Balance Overall balance assessment: Needs assistance Sitting-balance support: Feet supported Sitting balance-Leahy Scale: Good     Standing balance support: During functional activity,  Bilateral upper extremity supported, Single extremity supported, Reliant on assistive device for balance Standing  balance-Leahy Scale: Poor                              Cognition Arousal/Alertness: Awake/alert Behavior During Therapy: WFL for tasks assessed/performed Overall Cognitive Status: Within Functional Limits for tasks assessed                                          Exercises      General Comments        Pertinent Vitals/Pain Pain Assessment Pain Assessment: No/denies pain      08/04/22 0938  PT - End of Session  Equipment Utilized During Treatment Gait belt  Activity Tolerance Patient tolerated treatment well  Patient left in bed;with call bell/phone within reach;with bed alarm set;with nursing/sitter in room  Nurse Communication Mobility status   PT - Assessment/Plan  PT Plan Current plan remains appropriate  PT Visit Diagnosis Unsteadiness on feet (R26.81);Other abnormalities of gait and mobility (R26.89);Muscle weakness (generalized) (M62.81);Difficulty in walking, not elsewhere classified (R26.2);Other symptoms and signs involving the nervous system (R29.898)  PT Frequency (ACUTE ONLY) Min 4X/week  Assistance recommended at discharge Intermittent Supervision/Assistance  Patient can return home with the following A little help with walking and/or transfers;A little help with bathing/dressing/bathroom;Assistance with cooking/housework;Assist for transportation;Help with stairs or ramp for entrance  PT equipment Rolling walker (2 wheels);BSC/3in1 (TBD pending further progress)  AM-PAC PT "6 Clicks" Mobility Outcome Measure (Version 2)  Help needed turning from your back to your side while in a flat bed without using bedrails? 4  Help needed moving from lying on your back to sitting on the side of a flat bed without using bedrails? 4  Help needed moving to and from a bed to a chair (including a wheelchair)? 3  Help needed standing up from a chair using your arms (e.g., wheelchair or bedside chair)? 3  Help needed to walk in hospital room? 3  Help  needed climbing 3-5 steps with a railing?  3  6 Click Score 20  Consider Recommendation of Discharge To: Home with no services  Progressive Mobility  What is the highest level of mobility based on the progressive mobility assessment? Level 5 (Walks with assist in room/hall) - Balance while stepping forward/back and can walk in room with assist - Complete  Mobility Referral Yes  Activity Ambulated with assistance in hallway  PT Goal Progression  Progress towards PT goals Progressing toward goals  Acute Rehab PT Goals  PT Goal Formulation With patient  Time For Goal Achievement 08/16/22  Potential to Achieve Goals Good  PT Time Calculation  PT Start Time (ACUTE ONLY) 0935  PT Stop Time (ACUTE ONLY) 1000  PT Time Calculation (min) (ACUTE ONLY) 25 min  PT General Charges  $$ ACUTE PT VISIT 1 Visit  PT Treatments  $Gait Training 23-37 mins     Verner Mould, DPT Acute Rehabilitation Services Office [phone]  08/04/22 2:11 PM

## 2022-08-04 NOTE — TOC Initial Note (Signed)
Transition of Care [facility]) - Initial/Assessment Note    Patient Details  Name: Kimberly Briggs MRN: [id number] Date of Birth: 11/12/1975  Transition of Care [facility] CM/SW Contact:    Pollie Friar, RN Phone Number: 08/04/2022, 11:13 AM  Clinical Narrative:                 Pt is from home with her son who she cares for. Her younger son helps out and so does her cousin and mom. Pt has crutches at home that she recently received.  No medications prior to this.  She denies issues with transportation.  PCP: Family med at the [facility]. She prefers to attend outpatient at one of the [facility] facilities. CM has arranged outpatient PT at [facility]. Information on the AVS.  TOC following.  Expected Discharge Plan: OP Rehab Barriers to Discharge: Continued Medical Work up   Patient Goals and CMS Choice     Choice offered to / list presented to : Patient  Expected Discharge Plan and Services Expected Discharge Plan: OP Rehab   Discharge Planning Services: CM Consult   Living arrangements for the past 2 months: Apartment                                      Prior Living Arrangements/Services Living arrangements for the past 2 months: Apartment Lives with:: Adult Children   Do you feel safe going back to the place where you live?: Yes          Current home services: DME (crutches)    Activities of Daily Living Home Assistive Devices/Equipment: None ADL Screening (condition at time of admission) Patient's cognitive ability adequate to safely complete daily activities?: Yes Is the patient deaf or have difficulty hearing?: No Does the patient have difficulty seeing, even when wearing glasses/contacts?: No Does the patient have difficulty concentrating, remembering, or making decisions?: No Patient able to express need for assistance with ADLs?: Yes Does the patient have difficulty dressing or bathing?: No Independently performs ADLs?: Yes (appropriate for  developmental age) Does the patient have difficulty walking or climbing stairs?: Yes Weakness of Legs: Right Weakness of Arms/Hands: None  Permission Sought/Granted                  Emotional Assessment Appearance:: Appears stated age Attitude/Demeanor/Rapport: Engaged Affect (typically observed): Accepting Orientation: : Oriented to Self, Oriented to Place, Oriented to  Time, Oriented to Situation   Psych Involvement: No (comment)  Admission diagnosis:  Transverse myelitis ([place]) [G37.3] Weakness [R53.1] Lower extremity weakness [R29.898] Loss of feeling or sensation [R20.0] Patient Active Problem List   Diagnosis Date Noted   Leukocytosis 08/01/2022   Paresthesias 08/01/2022   Transverse myelitis ([place]) 08/01/2022   Lower extremity weakness 07/31/2022   PCP:  Patient, No Pcp Per Pharmacy:   [id number] - [facility], [place] AT [address] [place] [address] Phone: [phone] Fax: [phone]     Social Determinants of Health (SDOH) Interventions Housing Interventions: Patient Refused  Readmission Risk Interventions     No data to display

## 2022-08-04 NOTE — Progress Notes (Addendum)
PROGRESS NOTE    Kimberly Briggs  EPP:[id number] DOB: [birth year] DOA: 07/31/2022 PCP: Patient, No Pcp Per     Brief Narrative:  Kimberly Briggs is a 46 y.o. female with medical history significant for osteoarthritis involving the bilateral lower extremities who is admitted to [facility] on 07/31/2022 with weakness of the bilateral lower extremities. The patient reports new onset numbness and paresthesias involving, initially, the bilateral feet starting 3 days ago.  Since onset, she has noted ascension of these sensory changes into the proximal legs bilaterally and into the buttocks and vagina, but denies involvement of the anus.  Additionally, over the last 2 days, she has noted new onset weakness in the bilateral lower extremities, with the weakness in the right lower extremity greater than the left.   She initially presented with these complaints to [facility] emergency department, at which time MRI of the lumbar spine was completed. She was referred to outpatient spine specialist who did not have further recommendations, other than MRI with contrast. Patient decided to come to [facility] ED due to continued symptoms.   Neurology consulted.  Patient was started on IV Solu-Medrol for 5 days due to concern for transverse myelitis.  New events last 24 hours / Subjective: Her lower extremity strength and numbness continue to improve.  Having some intermittent chest pressure this morning, relieved with taking some deep breaths.   Assessment & Plan:   Principal Problem:   Lower extremity weakness Active Problems:   Leukocytosis   Paresthesias   Transverse myelitis (HCC)   Bilateral lower extremity weakness, question transverse myelitis -MRI: Patchy signal abnormality involving the distal cord/conus medullaris at the level of T12-L1 -Neurology following -Additional CSF/serum labs ordered -pending -UA and chest x-ray negative for acute infectious process -IV Solu-Medrol started  11/3-11/7 -PT eval rec outpatient PT   Chest pain -Rule out ACS with EKG and troponin today -EKG reviewed independently, normal sinus rhythm without any ST segment changes   DVT prophylaxis:  SCDs Start: 07/31/22 2257  Code Status: Full Family Communication: None at bedside Disposition Plan:  Status is: Inpatient Remains inpatient appropriate because: IV solumedrol   Consultants:  Neurology   Procedures:  None   Antimicrobials:  Anti-infectives (From admission, onward)    None        Objective: Vitals:   08/03/22 2327 08/04/22 0324 08/04/22 0500 08/04/22 0854  BP: 123/80 127/67  137/85  Pulse: 69 (!) 55  65  Resp: 14 12  17   Temp: 98 F (36.7 C) 98.6 F (37 C)  98.3 F (36.8 C)  TempSrc: Oral   Oral  SpO2: 99% 99%  100%  Weight:   103.6 kg   Height:        Intake/Output Summary (Last 24 hours) at 08/04/2022 1056 Last data filed at 08/04/2022 0327 Gross per 24 hour  Intake 240 ml  Output --  Net 240 ml   Filed Weights   07/31/22 1221 08/03/22 0500 08/04/22 0500  Weight: 100.7 kg 102.4 kg 103.6 kg    Examination: General exam: Appears calm and comfortable  Respiratory system: Clear to auscultation. Respiratory effort normal. No respiratory distress. No conversational dyspnea.  Cardiovascular system: S1 & S2 heard, RRR. No murmurs. No pedal edema. Gastrointestinal system: Abdomen is nondistended, soft and nontender. Normal bowel sounds heard. Central nervous system: Alert and oriented. Speech clear. Extremities: Symmetric in appearance  Skin: No rashes, lesions or ulcers on exposed skin  Psychiatry: Judgement and insight appear normal. Mood &  affect appropriate.   Data Reviewed: I have personally reviewed following labs and imaging studies  CBC: Recent Labs  Lab 07/31/22 1257 08/01/22 0512  WBC 13.5* 8.7  NEUTROABS 10.3* 4.5  HGB 13.1 11.9*  HCT 39.7 38.1  MCV 90.0 92.3  PLT 388 312    Basic Metabolic Panel: Recent Labs  Lab  07/31/22 1257 08/01/22 0512  NA 141 138  K 4.2 3.5  CL 104 106  CO2 23 24  GLUCOSE 97 110*  BUN 14 17  CREATININE 0.72 0.85  CALCIUM 9.7 8.8*  MG  --  2.1    GFR: Estimated Creatinine Clearance: 102 mL/min (by C-G formula based on SCr of 0.85 mg/dL). Liver Function Tests: Recent Labs  Lab 07/31/22 1257 08/01/22 0512  AST 21 20  ALT 13 13  ALKPHOS 61 57  BILITOT 0.3 0.6  PROT 7.5 6.6  ALBUMIN 3.6 3.1*    No results for input(s): "LIPASE", "AMYLASE" in the last 168 hours. No results for input(s): "AMMONIA" in the last 168 hours. Coagulation Profile: No results for input(s): "INR", "PROTIME" in the last 168 hours. Cardiac Enzymes: Recent Labs  Lab 07/31/22 1257  CKTOTAL 362*    BNP (last 3 results) No results for input(s): "PROBNP" in the last 8760 hours. HbA1C: No results for input(s): "HGBA1C" in the last 72 hours. CBG: No results for input(s): "GLUCAP" in the last 168 hours. Lipid Profile: No results for input(s): "CHOL", "HDL", "LDLCALC", "TRIG", "CHOLHDL", "LDLDIRECT" in the last 72 hours. Thyroid Function Tests: No results for input(s): "TSH", "T4TOTAL", "FREET4", "T3FREE", "THYROIDAB" in the last 72 hours.  Anemia Panel: No results for input(s): "VITAMINB12", "FOLATE", "FERRITIN", "TIBC", "IRON", "RETICCTPCT" in the last 72 hours.  Sepsis Labs: No results for input(s): "PROCALCITON", "LATICACIDVEN" in the last 168 hours.  Recent Results (from the past 240 hour(s))  CSF culture w Gram Stain     Status: None   Collection Time: 07/31/22  3:50 PM   Specimen: CSF; Cerebrospinal Fluid  Result Value Ref Range Status   Specimen Description CSF  Final   Special Requests NONE  Final   Gram Stain NO WBC SEEN NO ORGANISMS SEEN CYTOSPIN SMEAR   Final   Culture   Final    NO GROWTH 3 DAYS Performed at [facility], [address] [place], [place] [address]    Report Status 08/04/2022 FINAL  Final      Radiology Studies: No results  found.    Scheduled Meds:  polyethylene glycol  17 g Oral Daily   senna-docusate  1 tablet Oral BID   Continuous Infusions:  methylPREDNISolone (SOLU-MEDROL) injection 1,000 mg (08/03/22 1548)     LOS: 4 days     Noralee Stain, DO [facility] 08/04/2022, 10:56 AM   Available via Epic secure chat 7am-7pm After these hours, please refer to coverage provider listed on [email or web address]

## 2022-08-05 DIAGNOSIS — R29898 Other symptoms and signs involving the musculoskeletal system: Secondary | ICD-10-CM | POA: Diagnosis not present

## 2022-08-05 DIAGNOSIS — R0789 Other chest pain: Secondary | ICD-10-CM | POA: Insufficient documentation

## 2022-08-05 LAB — IGG CSF INDEX
Albumin CSF-mCnc: 18 mg/dL (ref 8–37)
Albumin: 3.9 g/dL (ref 3.9–4.9)
CSF IgG Index: 0.6 (ref 0.0–0.7)
IgG (Immunoglobin G), Serum: 1049 mg/dL (ref 586–1602)
IgG, CSF: 2.7 mg/dL (ref 0.0–6.7)
IgG/Alb Ratio, CSF: 0.15 (ref 0.00–0.25)

## 2022-08-05 LAB — ANCA TITERS
Atypical P-ANCA titer: <1:20 {titer}
C-ANCA: <1:20 {titer}
P-ANCA: <1:20 {titer}

## 2022-08-05 NOTE — TOC Transition Note (Signed)
Transition of Care [facility]) - CM/SW Discharge Note   Patient Details  Name: Kimberly Briggs MRN: [id number] Date of Birth: [birth year]  Transition of Care [facility]) CM/SW Contact:  Pollie Friar, RN Phone Number: 08/05/2022, 1:02 PM   Clinical Narrative:    Pt is discharging home with outpatient therapy through [facility]. Information on the AVS.  No DME needs.  Pt has transportation home.    Final next level of care: OP Rehab Barriers to Discharge: No Barriers Identified   Patient Goals and CMS Choice     Choice offered to / list presented to : Patient  Discharge Placement                       Discharge Plan and Services   Discharge Planning Services: CM Consult                                 Social Determinants of Health (SDOH) Interventions Housing Interventions: Patient Refused   Readmission Risk Interventions     No data to display

## 2022-08-05 NOTE — Progress Notes (Signed)
Physical Therapy Treatment Patient Details Name: Kimberly Briggs MRN: [id number] DOB: 02/20/1976 Today's Date: 08/05/2022   History of Present Illness 46 y.o. female with medical history significant for osteoarthritis involving the bilateral lower extremities who is admitted to [facility] on 07/31/2022 with weakness of the bilateral lower extremities, question transverse myelitis.    PT Comments    Patient educated in use of bil crutches as she already owns crutches and prefers to use them. Educated on risks of leaning on axillae and to stand upright with weight/pressure on her hands. Educated in both step-through gait and 4 point alternating gait with pt doing better with former. Patient deferred practicing steps as she did them yesterday and was able to verbalize the correct technique. Agrees to OPPT and is looking forward to getting back to her usual self.     Recommendations for follow up therapy are one component of a multi-disciplinary discharge planning process, led by the attending physician.  Recommendations may be updated based on patient status, additional functional criteria and insurance authorization.  Follow Up Recommendations  Outpatient PT     Assistance Recommended at Discharge Intermittent Supervision/Assistance  Patient can return home with the following A little help with walking and/or transfers;A little help with bathing/dressing/bathroom;Assistance with cooking/housework;Assist for transportation;Help with stairs or ramp for entrance   Equipment Recommendations  BSC/3in1 (pt progressed to crutches which she already owns)    Recommendations for Other Services       Precautions / Restrictions Precautions Precautions: Fall Precaution Comments: Rt foot drop Restrictions Weight Bearing Restrictions: No     Mobility  Bed Mobility Overal bed mobility: Independent                  Transfers Overall transfer level: Needs assistance Equipment used:  Crutches Transfers: Sit to/from Stand Sit to Stand: Supervision           General transfer comment: supervision for safety; cues for technique    Ambulation/Gait Ambulation/Gait assistance: Min guard, Supervision Gait Distance (Feet): 300 Feet Assistive device: Crutches Gait Pattern/deviations: Decreased stride length, Decreased dorsiflexion - right, Step-through pattern Gait velocity: decr     General Gait Details: Pt has crutches at home and prefers to use crutches. Educated in step-through gait and 4 point gait with pt doing better with step-through. Progressed to no cues needed and no LOB   Stairs Stairs:  (pt deferred as practiced 11/6; able to verbalize correct sequence)           Wheelchair Mobility    Modified Rankin (Stroke Patients Only)       Balance Overall balance assessment: Needs assistance Sitting-balance support: Feet supported Sitting balance-Leahy Scale: Good     Standing balance support: During functional activity, Bilateral upper extremity supported, Reliant on assistive device for balance Standing balance-Leahy Scale: Poor                              Cognition Arousal/Alertness: Awake/alert Behavior During Therapy: WFL for tasks assessed/performed Overall Cognitive Status: Within Functional Limits for tasks assessed                                          Exercises      General Comments        Pertinent Vitals/Pain Pain Assessment Pain Assessment: No/denies pain    Home  Living                          Prior Function            PT Goals (current goals can now be found in the care plan section) Acute Rehab PT Goals Patient Stated Goal: Get well, go home PT Goal Formulation: With patient Time For Goal Achievement: 08/16/22 Potential to Achieve Goals: Good Progress towards PT goals: Progressing toward goals    Frequency    Min 4X/week      PT Plan Current plan remains  appropriate    Co-evaluation              AM-PAC PT "6 Clicks" Mobility   Outcome Measure  Help needed turning from your back to your side while in a flat bed without using bedrails?: None Help needed moving from lying on your back to sitting on the side of a flat bed without using bedrails?: None Help needed moving to and from a bed to a chair (including a wheelchair)?: A Little Help needed standing up from a chair using your arms (e.g., wheelchair or bedside chair)?: A Little Help needed to walk in hospital room?: A Little Help needed climbing 3-5 steps with a railing? : A Little 6 Click Score: 20    End of Session   Activity Tolerance: Patient tolerated treatment well Patient left: in bed;with call bell/phone within reach Nurse Communication: Mobility status;Other (comment) (OK for discharge from PT perspective) PT Visit Diagnosis: Unsteadiness on feet (R26.81);Other abnormalities of gait and mobility (R26.89);Muscle weakness (generalized) (M62.81);Difficulty in walking, not elsewhere classified (R26.2);Other symptoms and signs involving the nervous system (R29.898)     Time: 2633-3545 PT Time Calculation (min) (ACUTE ONLY): 23 min  Charges:  $Gait Training: 23-37 mins                      Kimberly Briggs, PT Acute Rehabilitation Services  Office [phone]    Kimberly Briggs 08/05/2022, 3:59 PM

## 2022-08-05 NOTE — Discharge Summary (Signed)
Physician Discharge Summary  Kimberly Briggs ZOX:[id number] DOB: 19-Nov-1975 DOA: 07/31/2022  PCP: Patient, No Pcp Per  Admit date: 07/31/2022 Discharge date: 08/05/2022  Admitted From: Home Disposition:  Home   Recommendations for Outpatient Follow-up:  Follow up with neurology.  Referral sent Outpatient PT  Discharge Condition: Stable CODE STATUS: Full Diet recommendation: Code regular diet  Brief/Interim Summary: Kimberly Briggs is a 46 y.o. female with medical history significant for osteoarthritis involving the bilateral lower extremities who is admitted to [facility] on 07/31/2022 with weakness of the bilateral lower extremities. The patient reports new onset numbness and paresthesias involving, initially, the bilateral feet starting 3 days ago.  Since onset, she has noted ascension of these sensory changes into the proximal legs bilaterally and into the buttocks and vagina, but denies involvement of the anus.  Additionally, over the last 2 days, she has noted new onset weakness in the bilateral lower extremities, with the weakness in the right lower extremity greater than the left.    She initially presented with these complaints to [facility] regional emergency department, at which time MRI of the lumbar spine was completed. She was referred to outpatient spine specialist who did not have further recommendations, other than MRI with contrast. Patient decided to come to [facility] ED due to continued symptoms.    Neurology consulted.  Patient was started on IV Solu-Medrol for 5 days due to concern for transverse myelitis.  Due to patient's continued improvement with IV Solu-Medrol, infectious disease was not consulted.  She completed 5-day course with slow improvement of her symptoms and was discharged with outpatient PT and outpatient neurology follow-up  Discharge Diagnoses:   Principal Problem:   Lower extremity weakness Active Problems:   Leukocytosis   Paresthesias   Transverse  myelitis (HCC)   Atypical chest pain  Bilateral lower extremity weakness, question transverse myelitis -MRI: Patchy signal abnormality involving the distal cord/conus medullaris at the level of T12-L1 -Neurology following -Additional CSF/serum labs ordered -pending -UA and chest x-ray negative for acute infectious process -IV Solu-Medrol 11/3-11/7 -PT eval rec outpatient PT  -Follow-up with outpatient neurology   Atypical chest pain -EKG reviewed independently, normal sinus rhythm without any ST segment changes -Troponin negative   Discharge Instructions  Discharge Instructions     Ambulatory referral to Neurology   Complete by: As directed    An appointment is requested in approximately: 4 weeks   Ambulatory referral to Physical Therapy   Complete by: As directed    Call MD for:  difficulty breathing, headache or visual disturbances   Complete by: As directed    Call MD for:  extreme fatigue   Complete by: As directed    Call MD for:  persistant dizziness or light-headedness   Complete by: As directed    Call MD for:  persistant nausea and vomiting   Complete by: As directed    Call MD for:  severe uncontrolled pain   Complete by: As directed    Call MD for:  temperature >100.4   Complete by: As directed    Diet general   Complete by: As directed    Discharge instructions   Complete by: As directed    You were cared for by a hospitalist during your hospital stay. If you have any questions about your discharge medications or the care you received while you were in the hospital after you are discharged, you can call the unit and ask to speak with the hospitalist on call if  the hospitalist that took care of you is not available. Once you are discharged, your primary care physician will handle any further medical issues. Please note that NO REFILLS for any discharge medications will be authorized once you are discharged, as it is imperative that you return to your primary care  physician (or establish a relationship with a primary care physician if you do not have one) for your aftercare needs so that they can reassess your need for medications and monitor your lab values.   Increase activity slowly   Complete by: As directed       Allergies as of 08/05/2022       Reactions   Codeine Itching        Medication List     STOP taking these medications    meloxicam 15 MG tablet Commonly known as: MOBIC   methylPREDNISolone 4 MG Tbpk tablet Commonly known as: MEDROL DOSEPAK   metroNIDAZOLE 500 MG tablet Commonly known as: FLAGYL   ondansetron 8 MG disintegrating tablet Commonly known as: Zofran ODT   triamcinolone acetonide 40 MG/ML injection Commonly known as: KENALOG-40       TAKE these medications    acetaminophen 500 MG tablet Commonly known as: TYLENOL Take by mouth.   aspirin EC 325 MG tablet Take by mouth.   ibuprofen 400 MG tablet Commonly known as: ADVIL Take 800 mg by mouth every 6 (six) hours as needed for mild pain.   lidocaine 4 % cream Commonly known as: LMX Apply 1 Application topically daily as needed (pain). What changed: Another medication with the same name was removed. Continue taking this medication, and follow the directions you see here.   multivitamin with minerals Tabs tablet Take 1 tablet by mouth daily.   OVER THE COUNTER MEDICATION Place 2 drops in ear(s) daily as needed. [facility] ear drops "For infection" (OTC product)        Follow-up Information     [facility]. Schedule an appointment as soon as possible for a visit in 1 week(s).   Specialty: Rehabilitation Contact information: [address]  [address] [id number] [place] FO[place] [place] [address] [phone]        [facility] Follow up in 1 month(s).   Why: Outpatient referral sent Contact information: [address]     [address] [place]  [address] [phone]               Allergies  Allergen Reactions   Codeine Itching    Consultations: Neurology    Procedures/Studies: [facility] Chest Port 1 View  Result Date: 08/01/2022 CLINICAL DATA:  Leukocytosis EXAM: PORTABLE CHEST 1 VIEW COMPARISON:  None Available. FINDINGS: The heart size and mediastinal contours are within normal limits. Both lungs are clear. The visualized skeletal structures are unremarkable. IMPRESSION: Negative Electronically Signed   By: Charlett Nose M.D.   On: 08/01/2022 02:13   MR Lumbar Spine W Wo Contrast  Result Date: 07/31/2022 CLINICAL DATA:  Initial evaluation for loss of sensation in bilateral lower extremities, trunk numbness, tingling. EXAM: MRI THORACIC AND LUMBAR SPINE WITHOUT AND WITH CONTRAST TECHNIQUE: Multiplanar and multiecho pulse sequences of the thoracic and lumbar spine were obtained without and with intravenous contrast. CONTRAST:  65mL GADAVIST GADOBUTROL 1 MMOL/ML IV SOLN COMPARISON:  Comparison made with recent MRI from 07/29/2022. FINDINGS: MRI THORACIC SPINE FINDINGS Alignment: Physiologic with preservation of the normal thoracic kyphosis. No listhesis. Vertebrae: Vertebral body height maintained without acute or interval  fracture. Bone marrow signal intensity within normal limits. No discrete or worrisome osseous lesions. No abnormal marrow edema or enhancement. Cord: Now evident is patchy signal abnormality involving the distal cord/conus medullaris at the level of T12-L1 (series 12, image 8). No appreciable associated enhancement. This appears to be new and/or at least now visible since prior MRI from 07/19/2022. A shin remainder of the thoracic cord remains otherwise normal in appearance. Paraspinal and other soft tissues: Paraspinous soft tissues demonstrate no acute finding. Small simple cyst noted within the left kidney, benign in appearance, no follow-up imaging recommended. Disc levels: Previously identified small disc  protrusions at T5-6 and T8-9 without significant stenosis or impingement, stable. No other new or significant disc pathology. No stenosis or impingement. MRI LUMBAR SPINE FINDINGS Segmentation: Standard. Lowest well-formed disc space labeled the L5-S1 level. Alignment: Trace facet mediated anterolisthesis of L3 on L4 and L4 on L5. Vertebrae: Vertebral body height maintained without acute or interval fracture. Bone marrow signal intensity heterogeneous but overall within normal limits. Benign hemangioma noted within the S1 vertebral body. No worrisome osseous lesions. No other significant abnormal marrow edema or enhancement. Conus medullaris: Extends to the L1 level and appears normal. Patchy signal abnormality involving the distal cord/conus medullaris (series 19, image 8). No discernible associated enhancement. Nerve roots of the cauda equina and themselves relatively normal in appearance without significant nerve root thickening or enhancement. Paraspinal and other soft tissues: Paraspinous soft tissues demonstrate no acute finding. Probable fibroid uterus noted. 1.7 cm soft tissue density protrudes into the endometrial cavity, possibly a submucosal fibroid, but indeterminate (series 19, image 7). Disc levels: Underlying degenerative disc disease and facet hypertrophy, not significantly changed from recent MRI from 07/29/2022, fully described on that examination. IMPRESSION: 1. Patchy signal abnormality involving the distal cord/conus medullaris at the level of T12-L1, new since prior MRI from 07/29/2022. Findings are nonspecific, but could reflect changes of transverse myelitis, which could be either infectious or inflammatory in nature. A demyelinating process could also be considered. No associated enhancement. No visible involvement of the nerve roots of the cauda equina. 2. Otherwise stable MRI of the thoracic and lumbar spine with underlying degenerative spondylosis, fully described or recent MRI from  07/29/2022. 3. Fibroid uterus. 1.7 cm soft tissue lesion protrudes into the endometrial cavity, possibly a submucosal fibroid, but incompletely assessed on this exam. Further evaluation with dedicated pelvic ultrasound recommended. Electronically Signed   By: Rise Mu M.D.   On: 07/31/2022 19:46   MR THORACIC SPINE W WO CONTRAST  Result Date: 07/31/2022 CLINICAL DATA:  Initial evaluation for loss of sensation in bilateral lower extremities, trunk numbness, tingling. EXAM: MRI THORACIC AND LUMBAR SPINE WITHOUT AND WITH CONTRAST TECHNIQUE: Multiplanar and multiecho pulse sequences of the thoracic and lumbar spine were obtained without and with intravenous contrast. CONTRAST:  15mL GADAVIST GADOBUTROL 1 MMOL/ML IV SOLN COMPARISON:  Comparison made with recent MRI from 07/29/2022. FINDINGS: MRI THORACIC SPINE FINDINGS Alignment: Physiologic with preservation of the normal thoracic kyphosis. No listhesis. Vertebrae: Vertebral body height maintained without acute or interval fracture. Bone marrow signal intensity within normal limits. No discrete or worrisome osseous lesions. No abnormal marrow edema or enhancement. Cord: Now evident is patchy signal abnormality involving the distal cord/conus medullaris at the level of T12-L1 (series 12, image 8). No appreciable associated enhancement. This appears to be new and/or at least now visible since prior MRI from 07/19/2022. A shin remainder of the thoracic cord remains otherwise normal in appearance. Paraspinal and other  soft tissues: Paraspinous soft tissues demonstrate no acute finding. Small simple cyst noted within the left kidney, benign in appearance, no follow-up imaging recommended. Disc levels: Previously identified small disc protrusions at T5-6 and T8-9 without significant stenosis or impingement, stable. No other new or significant disc pathology. No stenosis or impingement. MRI LUMBAR SPINE FINDINGS Segmentation: Standard. Lowest well-formed disc  space labeled the L5-S1 level. Alignment: Trace facet mediated anterolisthesis of L3 on L4 and L4 on L5. Vertebrae: Vertebral body height maintained without acute or interval fracture. Bone marrow signal intensity heterogeneous but overall within normal limits. Benign hemangioma noted within the S1 vertebral body. No worrisome osseous lesions. No other significant abnormal marrow edema or enhancement. Conus medullaris: Extends to the L1 level and appears normal. Patchy signal abnormality involving the distal cord/conus medullaris (series 19, image 8). No discernible associated enhancement. Nerve roots of the cauda equina and themselves relatively normal in appearance without significant nerve root thickening or enhancement. Paraspinal and other soft tissues: Paraspinous soft tissues demonstrate no acute finding. Probable fibroid uterus noted. 1.7 cm soft tissue density protrudes into the endometrial cavity, possibly a submucosal fibroid, but indeterminate (series 19, image 7). Disc levels: Underlying degenerative disc disease and facet hypertrophy, not significantly changed from recent MRI from 07/29/2022, fully described on that examination. IMPRESSION: 1. Patchy signal abnormality involving the distal cord/conus medullaris at the level of T12-L1, new since prior MRI from 07/29/2022. Findings are nonspecific, but could reflect changes of transverse myelitis, which could be either infectious or inflammatory in nature. A demyelinating process could also be considered. No associated enhancement. No visible involvement of the nerve roots of the cauda equina. 2. Otherwise stable MRI of the thoracic and lumbar spine with underlying degenerative spondylosis, fully described or recent MRI from 07/29/2022. 3. Fibroid uterus. 1.7 cm soft tissue lesion protrudes into the endometrial cavity, possibly a submucosal fibroid, but incompletely assessed on this exam. Further evaluation with dedicated pelvic ultrasound recommended.  Electronically Signed   By: Rise Mu M.D.   On: 07/31/2022 19:46   MR Cervical Spine W or Wo Contrast  Result Date: 07/31/2022 CLINICAL DATA:  Initial evaluation for sensation loss in bilateral lower extremities. EXAM: MRI CERVICAL SPINE WITHOUT AND WITH CONTRAST TECHNIQUE: Multiplanar and multiecho pulse sequences of the cervical spine, to include the craniocervical junction and cervicothoracic junction, were obtained without and with intravenous contrast. CONTRAST:  67mL GADAVIST GADOBUTROL 1 MMOL/ML IV SOLN COMPARISON:  None Available. FINDINGS: Alignment: Examination degraded by motion. Straightening of the normal cervical lordosis.  No listhesis. Vertebrae: Vertebral body height maintained without acute or chronic fracture. Bone marrow signal intensity within normal limits. No discrete or worrisome osseous lesions. No abnormal marrow edema or enhancement. Cord: Normal signal and morphology.  No abnormal enhancement. Posterior Fossa, vertebral arteries, paraspinal tissues: Small bilateral mastoid effusions partially visualized. Otherwise unremarkable. Disc levels: C2-C3: Right eccentric disc osteophyte complex with bilateral uncovertebral spurring. No significant spinal stenosis. Foramina remain adequately patent. C3-C4: Diffuse disc bulge with bilateral uncovertebral spurring, asymmetric to the right. Flattening and partial effacement of the ventral thecal sac. Mild spinal stenosis with moderate bilateral C4 foraminal narrowing. C4-C5: Degenerative intervertebral disc space narrowing with diffuse disc osteophyte complex. Flattening and partial effacement of the ventral thecal sac with resultant mild spinal stenosis. Severe right with moderate left C5 foraminal stenosis. C5-C6: Degenerative intervertebral disc space narrowing with diffuse disc osteophyte complex. Flattening and partial effacement of the ventral thecal sac. Mild spinal stenosis. Severe right with moderate left C6  foraminal  narrowing. C6-C7:  Unremarkable. C7-T1: Negative interspace. Mild left-sided facet hypertrophy. No stenosis. IMPRESSION: 1. Normal MRI appearance of the cervical spinal cord. 2. Multilevel cervical spondylosis with resultant mild spinal stenosis at C3-4 through C5-6. 3. Multifactorial degenerative changes with resultant multilevel foraminal narrowing as above. Notable findings include moderate bilateral C4 foraminal stenosis, with severe right and moderate left C5 and C6 foraminal narrowing. Electronically Signed   By: Jeannine Boga M.D.   On: 07/31/2022 18:54       Discharge Exam: Vitals:   08/05/22 0859 08/05/22 1219  BP: (!) 145/96 128/82  Pulse: 65 73  Resp: 19 20  Temp: 97.9 F (36.6 C) 98.2 F (36.8 C)  SpO2: 99% 100%    General: Pt is alert, awake, not in acute distress Cardiovascular: RRR, S1/S2 +, no edema Respiratory: CTA bilaterally, no wheezing, no rhonchi, no respiratory distress, no conversational dyspnea  Abdominal: Soft, NT, ND, bowel sounds + Extremities: no edema, no cyanosis Psych: Normal mood and affect, stable judgement and insight     The results of significant diagnostics from this hospitalization (including imaging, microbiology, ancillary and laboratory) are listed below for reference.     Microbiology: Recent Results (from the past 240 hour(s))  CSF culture w Gram Stain     Status: None   Collection Time: 07/31/22  3:50 PM   Specimen: CSF; Cerebrospinal Fluid  Result Value Ref Range Status   Specimen Description CSF  Final   Special Requests NONE  Final   Gram Stain NO WBC SEEN NO ORGANISMS SEEN CYTOSPIN SMEAR   Final   Culture   Final    NO GROWTH 3 DAYS Performed at [facility], [address] [place], [place] [address]    Report Status 08/04/2022 FINAL  Final     Labs: BNP (last 3 results) No results for input(s): "BNP" in the last 8760 hours. Basic Metabolic Panel: Recent Labs  Lab 07/31/22 1257 08/01/22 0512  NA 141  138  K 4.2 3.5  CL 104 106  CO2 23 24  GLUCOSE 97 110*  BUN 14 17  CREATININE 0.72 0.85  CALCIUM 9.7 8.8*  MG  --  2.1   Liver Function Tests: Recent Labs  Lab 07/31/22 1257 07/31/22 1550 08/01/22 0512  AST 21  --  20  ALT 13  --  13  ALKPHOS 61  --  57  BILITOT 0.3  --  0.6  PROT 7.5  --  6.6  ALBUMIN 3.6 3.9 3.1*   No results for input(s): "LIPASE", "AMYLASE" in the last 168 hours. No results for input(s): "AMMONIA" in the last 168 hours. CBC: Recent Labs  Lab 07/31/22 1257 08/01/22 0512  WBC 13.5* 8.7  NEUTROABS 10.3* 4.5  HGB 13.1 11.9*  HCT 39.7 38.1  MCV 90.0 92.3  PLT 388 312   Cardiac Enzymes: Recent Labs  Lab 07/31/22 1257  CKTOTAL 362*   BNP: Invalid input(s): "POCBNP" CBG: No results for input(s): "GLUCAP" in the last 168 hours. D-Dimer No results for input(s): "DDIMER" in the last 72 hours. Hgb A1c No results for input(s): "HGBA1C" in the last 72 hours. Lipid Profile No results for input(s): "CHOL", "HDL", "LDLCALC", "TRIG", "CHOLHDL", "LDLDIRECT" in the last 72 hours. Thyroid function studies No results for input(s): "TSH", "T4TOTAL", "T3FREE", "THYROIDAB" in the last 72 hours.  Invalid input(s): "FREET3" Anemia work up No results for input(s): "VITAMINB12", "FOLATE", "FERRITIN", "TIBC", "IRON", "RETICCTPCT" in the last 72 hours. Urinalysis  Component Value Date/Time   COLORURINE YELLOW 08/01/2022 1039   APPEARANCEUR HAZY (A) 08/01/2022 1039   LABSPEC 1.016 08/01/2022 1039   PHURINE 5.0 08/01/2022 1039   GLUCOSEU NEGATIVE 08/01/2022 1039   HGBUR NEGATIVE 08/01/2022 1039   BILIRUBINUR NEGATIVE 08/01/2022 1039   KETONESUR NEGATIVE 08/01/2022 1039   PROTEINUR NEGATIVE 08/01/2022 1039   NITRITE NEGATIVE 08/01/2022 1039   LEUKOCYTESUR NEGATIVE 08/01/2022 1039   Sepsis Labs Recent Labs  Lab 07/31/22 1257 08/01/22 0512  WBC 13.5* 8.7   Microbiology Recent Results (from the past 240 hour(s))  CSF culture w Gram Stain      Status: None   Collection Time: 07/31/22  3:50 PM   Specimen: CSF; Cerebrospinal Fluid  Result Value Ref Range Status   Specimen Description CSF  Final   Special Requests NONE  Final   Gram Stain NO WBC SEEN NO ORGANISMS SEEN CYTOSPIN SMEAR   Final   Culture   Final    NO GROWTH 3 DAYS Performed at [facility], [address], [place], [place] [address]    Report Status 08/04/2022 FINAL  Final     Patient was seen and examined on the day of discharge and was found to be in stable condition. Time coordinating discharge: 25 minutes including assessment and coordination of care, as well as examination of the patient.   SIGNED:  Noralee Stain, DO [facility] 08/05/2022, 12:36 PM

## 2022-08-05 NOTE — Progress Notes (Signed)
PIV removed. Discharge instructions completed. Patient verbalized understanding of medication regimen, follow up appointments and discharge instructions. Patient belongings gathered and packed to discharge.  

## 2022-08-06 LAB — MISC LABCORP TEST (SEND OUT): Labcorp test code: 50531

## 2022-08-06 LAB — OLIGOCLONAL BANDS, CSF + SERM

## 2022-08-07 NOTE — Progress Notes (Signed)
08/07/2022 at 1107 am Pt has [facility]. She is not in network with [facility] outpatient therapy. CM has faxed her information to [facility] therapy at [facility] with pt permission.

## 2022-08-11 LAB — MISC LABCORP TEST (SEND OUT): Labcorp test code: 138966

## 2022-08-11 LAB — ANTI-MYELIN ASSOC GLYCOP IGG

## 2022-10-17 ENCOUNTER — Emergency Department (HOSPITAL_BASED_OUTPATIENT_CLINIC_OR_DEPARTMENT_OTHER)
Admission: EM | Admit: 2022-10-17 | Discharge: 2022-10-17 | Disposition: A | Discharge status: Home / Self Care | Payer: BLUE CROSS/BLUE SHIELD | Attending: Emergency Medicine | Admitting: Emergency Medicine

## 2022-10-17 ENCOUNTER — Other Ambulatory Visit: Payer: Self-pay

## 2022-10-17 DIAGNOSIS — Z7982 Long term (current) use of aspirin: Secondary | ICD-10-CM | POA: Diagnosis not present

## 2022-10-17 DIAGNOSIS — R059 Cough, unspecified: Secondary | ICD-10-CM | POA: Diagnosis present

## 2022-10-17 DIAGNOSIS — J101 Influenza due to other identified influenza virus with other respiratory manifestations: Secondary | ICD-10-CM | POA: Insufficient documentation

## 2022-10-17 DIAGNOSIS — Z20822 Contact with and (suspected) exposure to covid-19: Secondary | ICD-10-CM | POA: Insufficient documentation

## 2022-10-17 LAB — RESP PANEL BY RT-PCR (RSV, FLU A&B, COVID)  RVPGX2
Influenza A by PCR: POSITIVE — AB
Influenza B by PCR: NEGATIVE
Resp Syncytial Virus by PCR: NEGATIVE
SARS Coronavirus 2 by RT PCR: NEGATIVE

## 2022-10-17 MED ORDER — KETOROLAC TROMETHAMINE 60 MG/2ML IM SOLN
30.0000 mg | Freq: Once | INTRAMUSCULAR | Status: AC
  Administered 2022-10-17: 30 mg via INTRAMUSCULAR
  Filled 2022-10-17: qty 2

## 2022-10-17 MED ORDER — ACETAMINOPHEN 325 MG PO TABS: 650.0000 mg | ORAL_TABLET | Freq: Four times a day (QID) | ORAL | 0 refills | Status: DC | PRN

## 2022-10-17 MED ORDER — OSELTAMIVIR PHOSPHATE 75 MG PO CAPS: 75.0000 mg | ORAL_CAPSULE | Freq: Two times a day (BID) | ORAL | 0 refills | Status: DC

## 2022-10-17 MED ORDER — ACETAMINOPHEN 325 MG PO TABS: 650.0000 mg | ORAL_TABLET | Freq: Once | ORAL | Status: DC | PRN

## 2022-10-17 MED ORDER — GUAIFENESIN 100 MG/5ML PO LIQD
5.0000 mL | Freq: Once | ORAL | Status: AC
  Administered 2022-10-17: 5 mL via ORAL
  Filled 2022-10-17: qty 10

## 2022-10-17 MED ORDER — IBUPROFEN 600 MG PO TABS: 600.0000 mg | ORAL_TABLET | Freq: Four times a day (QID) | ORAL | 0 refills | Status: DC | PRN

## 2022-10-17 MED ORDER — DEXTROMETHORPHAN HBR 15 MG/5ML PO SYRP: 10.0000 mL | ORAL_SOLUTION | Freq: Four times a day (QID) | ORAL | 0 refills | Status: DC | PRN

## 2022-10-17 MED ORDER — ACETAMINOPHEN 325 MG PO TABS
650.0000 mg | ORAL_TABLET | Freq: Once | ORAL | Status: AC
  Administered 2022-10-17: 650 mg via ORAL
  Filled 2022-10-17: qty 2

## 2022-10-17 NOTE — Discharge Instructions (Addendum)
It was a pleasure caring for you today in the emergency department.  Please return to the emergency department for any worsening or worrisome symptoms.  

## 2022-10-17 NOTE — ED Triage Notes (Signed)
Pt presents with cough, congestion, fever, and body aches.  [facility] she reports.  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

## 2022-10-17 NOTE — ED Notes (Signed)
Pt. Is positive for Flu

## 2022-10-17 NOTE — ED Provider Notes (Signed)
[facility] Provider Note  CSN: [id number] Arrival date & time: 10/17/22 2024  Chief Complaint(s) Cough  HPI Kimberly Briggs is a 47 y.o. female with past medical history as below, significant for OA to knees, transverse myelitis who presents to the ED with complaint of cough, uri s/s.  Patient works at [employer], multiple residents with RSV, flu.  Also) with viral infection.  Patient reports approximate 24 hours of cough, congestion, URI symptoms, subjective fever.  Body aches.  No nausea or vomiting, no dyspnea.  No chest pain.  No rashes.  Patient did not receive influenza vaccine this year.  No medications prior to arrival.  Her primary complaint is her nonproductive cough fever and bodyaches.  Past Medical History Past Medical History:  Diagnosis Date   OA (osteoarthritis)    (Involving the bilateral knees)   Patient Active Problem List   Diagnosis Date Noted   Atypical chest pain 08/05/2022   Leukocytosis 08/01/2022   Paresthesias 08/01/2022   Transverse myelitis ([place]) 08/01/2022   Lower extremity weakness 07/31/2022   Home Medication(s) Prior to Admission medications   Medication Sig Start Date End Date Taking? Authorizing Provider  acetaminophen (TYLENOL) 325 MG tablet Take 2 tablets (650 mg total) by mouth every 6 (six) hours as needed. 10/17/22  Yes Wynona Dove A, DO  dextromethorphan 15 MG/5ML syrup Take 10 mLs (30 mg total) by mouth 4 (four) times daily as needed for cough. 10/17/22  Yes Wynona Dove A, DO  ibuprofen (ADVIL) 600 MG tablet Take 1 tablet (600 mg total) by mouth every 6 (six) hours as needed. 10/17/22  Yes Jeanell Sparrow, DO  oseltamivir (TAMIFLU) 75 MG capsule Take 1 capsule (75 mg total) by mouth every 12 (twelve) hours. 10/17/22  Yes Wynona Dove A, DO  aspirin EC 325 MG tablet Take by mouth. 12/13/20   [provider]  lidocaine (LMX) 4 % cream Apply 1 Application topically daily as needed (pain).     [provider]  Multiple Vitamin (MULTIVITAMIN WITH MINERALS) TABS tablet Take 1 tablet by mouth daily.    [provider]  OVER THE COUNTER MEDICATION Place 2 drops in ear(s) daily as needed. [facility] ear drops "For infection" (OTC product)    [provider]                                                                                                                                    Past Surgical History Past Surgical History:  Procedure Laterality Date   TUBAL LIGATION     Family History No family history on file.  Social History Social History   Tobacco Use   Smoking status: Never  Substance Use Topics   Alcohol use: Yes    Comment: occ   Drug use: No   Allergies Codeine  Review of Systems Review of Systems  Constitutional:  Positive for fever.  Negative for activity change.  HENT:  Negative for facial swelling and trouble swallowing.   Eyes:  Negative for discharge and redness.  Respiratory:  Positive for cough. Negative for shortness of breath.   Cardiovascular:  Negative for chest pain and palpitations.  Gastrointestinal:  Negative for abdominal pain and nausea.  Genitourinary:  Negative for dysuria and flank pain.  Musculoskeletal:  Negative for back pain and gait problem.  Skin:  Negative for pallor and rash.  Neurological:  Negative for syncope and headaches.    Physical Exam Vital Signs  I have reviewed the triage vital signs BP (!) 141/86 (BP Location: Left Arm)   Pulse 96   Temp 100 F (37.8 C) (Oral)   Resp 18   SpO2 97%  Physical Exam Vitals and nursing note reviewed.  Constitutional:      General: She is not in acute distress.    Appearance: Normal appearance. She is not ill-appearing or diaphoretic.  HENT:     Head: Normocephalic and atraumatic.     Right Ear: External ear normal.     Left Ear: External ear normal.     Nose: Nose normal.     Mouth/Throat:     Mouth: Mucous membranes are moist.  Eyes:      General: No scleral icterus.       Right eye: No discharge.        Left eye: No discharge.  Cardiovascular:     Rate and Rhythm: Normal rate and regular rhythm.     Pulses: Normal pulses.     Heart sounds: Normal heart sounds.  Pulmonary:     Effort: Pulmonary effort is normal. No respiratory distress.     Breath sounds: Normal breath sounds. No wheezing or rales.  Abdominal:     General: Abdomen is flat.     Tenderness: There is no abdominal tenderness.  Musculoskeletal:        General: Normal range of motion.     Cervical back: Normal range of motion.     Right lower leg: No edema.     Left lower leg: No edema.  Skin:    General: Skin is warm and dry.     Capillary Refill: Capillary refill takes less than 2 seconds.  Neurological:     Mental Status: She is alert.  Psychiatric:        Mood and Affect: Mood normal.        Behavior: Behavior normal.     ED Results and Treatments Labs (all labs ordered are listed, but only abnormal results are displayed) Labs Reviewed  RESP PANEL BY RT-PCR (RSV, FLU A&B, COVID)  RVPGX2 - Abnormal; Notable for the following components:      Result Value   Influenza A by PCR POSITIVE (*)    All other components within normal limits  Radiology No results found.  Pertinent labs & imaging results that were available during my care of the patient were reviewed by me and considered in my medical decision making (see MDM for details).  Medications Ordered in ED Medications  guaiFENesin (ROBITUSSIN) 100 MG/5ML liquid 5 mL (5 mLs Oral Given 10/17/22 2215)  ketorolac (TORADOL) injection 30 mg (30 mg Intramuscular Given 10/17/22 2217)  acetaminophen (TYLENOL) tablet 650 mg (650 mg Oral Given 10/17/22 2216)                                                                                                                                      Procedures Procedures  (including critical care time)  Medical Decision Making / ED Course   MDM:  Kimberly Briggs is a 47 y.o. female with past medical history as below, significant for OA to knees, transverse myelitis who presents to the ED with complaint of cough, uri s/s. Marland Kitchen The complaint involves an extensive differential diagnosis and also carries with it a high risk of complications and morbidity.  Serious etiology was considered. Ddx includes but is not limited to: COVID-19, flu, RSV, less likely pneumonia, viral syndrome, etc.  On initial assessment the patient is: Resting comfortably on ambient air, no acute distress.  No conversational dyspnea. Vital signs and nursing notes were reviewed    Influenza positive. Lungs clear to auscultation bilateral.  Pneumonia seems unlikely Will give patient antitussive, Toradol, Tylenol, discussed supportive care at home.  Will start on Tamiflu and antitussive at home.  Rehydration encouraged.  Work note provided.  Patient was previously diagnosed with transverse myelitis.  She is tolerating physical therapy well, improving her functional status, gait improving.  Symptoms today likely secondary to influenza, she is stable for discharge home.  No respiratory difficulty.  The patient improved significantly and was discharged in stable condition. Detailed discussions were had with the patient regarding current findings, and need for close f/u with PCP or on call doctor. The patient has been instructed to return immediately if the symptoms worsen in any way for re-evaluation. Patient verbalized understanding and is in agreement with current care plan. All questions answered prior to discharge.    Additional history obtained: -Additional history obtained from na -External records from outside source obtained and reviewed including: Chart review including previous notes, labs, imaging, consultation notes including primary care documentation, prior  labs and imaging, prior admission   Lab Tests: -I ordered, reviewed, and interpreted labs.   The pertinent results include:   Labs Reviewed  RESP PANEL BY RT-PCR (RSV, FLU A&B, COVID)  RVPGX2 - Abnormal; Notable for the following components:      Result Value   Influenza A by PCR POSITIVE (*)    All other components within normal limits    Notable for flu positive  EKG   EKG Interpretation  Date/Time:    Ventricular Rate:    PR Interval:  QRS Duration:   QT Interval:    QTC Calculation:   R Axis:     Text Interpretation:           Imaging Studies ordered: I ordered imaging studies including na    Medicines ordered and prescription drug management: Meds ordered this encounter  Medications   DISCONTD: acetaminophen (TYLENOL) tablet 650 mg   guaiFENesin (ROBITUSSIN) 100 MG/5ML liquid 5 mL   ketorolac (TORADOL) injection 30 mg   acetaminophen (TYLENOL) tablet 650 mg   oseltamivir (TAMIFLU) 75 MG capsule    Sig: Take 1 capsule (75 mg total) by mouth every 12 (twelve) hours.    Dispense:  10 capsule    Refill:  0   ibuprofen (ADVIL) 600 MG tablet    Sig: Take 1 tablet (600 mg total) by mouth every 6 (six) hours as needed.    Dispense:  30 tablet    Refill:  0   acetaminophen (TYLENOL) 325 MG tablet    Sig: Take 2 tablets (650 mg total) by mouth every 6 (six) hours as needed.    Dispense:  36 tablet    Refill:  0   dextromethorphan 15 MG/5ML syrup    Sig: Take 10 mLs (30 mg total) by mouth 4 (four) times daily as needed for cough.    Dispense:  120 mL    Refill:  0    -I have reviewed the patients home medicines and have made adjustments as needed   Consultations Obtained: Not applicable  Cardiac Monitoring: Not applicable Social Determinants of Health:  Diagnosis or treatment significantly limited by social determinants of health: no pcp   Reevaluation: After the interventions noted above, I reevaluated the patient and found that they have  improved  Co morbidities that complicate the patient evaluation  Past Medical History:  Diagnosis Date   OA (osteoarthritis)    (Involving the bilateral knees)      Dispostion: Disposition decision including need for hospitalization was considered, and patient discharged from emergency department.    Final Clinical Impression(s) / ED Diagnoses Final diagnoses:  Influenza A     This chart was dictated using voice recognition software.  Despite best efforts to proofread,  errors can occur which can change the documentation meaning.    Sloan Leiter, DO 10/17/22 2256

## 2023-11-24 ENCOUNTER — Encounter (HOSPITAL_BASED_OUTPATIENT_CLINIC_OR_DEPARTMENT_OTHER): Payer: Self-pay | Admitting: Emergency Medicine

## 2023-11-24 ENCOUNTER — Other Ambulatory Visit: Payer: Self-pay

## 2023-11-24 ENCOUNTER — Emergency Department (HOSPITAL_BASED_OUTPATIENT_CLINIC_OR_DEPARTMENT_OTHER)
Admission: EM | Admit: 2023-11-24 | Discharge: 2023-11-24 | Disposition: A | Discharge status: Home / Self Care | Payer: BLUE CROSS/BLUE SHIELD | Attending: Emergency Medicine | Admitting: Emergency Medicine

## 2023-11-24 DIAGNOSIS — Z7982 Long term (current) use of aspirin: Secondary | ICD-10-CM | POA: Diagnosis not present

## 2023-11-24 DIAGNOSIS — R252 Cramp and spasm: Secondary | ICD-10-CM

## 2023-11-24 DIAGNOSIS — M79662 Pain in left lower leg: Secondary | ICD-10-CM | POA: Diagnosis present

## 2023-11-24 DIAGNOSIS — R350 Frequency of micturition: Secondary | ICD-10-CM | POA: Diagnosis not present

## 2023-11-24 LAB — URINALYSIS, ROUTINE W REFLEX MICROSCOPIC
Bilirubin Urine: NEGATIVE
Glucose, UA: NEGATIVE mg/dL
Hgb urine dipstick: NEGATIVE
Ketones, ur: NEGATIVE mg/dL
Leukocytes,Ua: NEGATIVE
Nitrite: NEGATIVE
Protein, ur: NEGATIVE mg/dL
Specific Gravity, Urine: 1.01 (ref 1.005–1.030)
pH: 5 (ref 5.0–8.0)

## 2023-11-24 LAB — CBC WITH DIFFERENTIAL/PLATELET
Abs Immature Granulocytes: 0.01 10*3/uL (ref 0.00–0.07)
Basophils Absolute: 0.1 10*3/uL (ref 0.0–0.1)
Basophils Relative: 1 %
Eosinophils Absolute: 0.1 10*3/uL (ref 0.0–0.5)
Eosinophils Relative: 1 %
HCT: 33.6 % — ABNORMAL LOW (ref 36.0–46.0)
Hemoglobin: 10.7 g/dL — ABNORMAL LOW (ref 12.0–15.0)
Immature Granulocytes: 0 %
Lymphocytes Relative: 25 %
Lymphs Abs: 2 10*3/uL (ref 0.7–4.0)
MCH: 26 pg (ref 26.0–34.0)
MCHC: 31.8 g/dL (ref 30.0–36.0)
MCV: 81.8 fL (ref 80.0–100.0)
Monocytes Absolute: 0.7 10*3/uL (ref 0.1–1.0)
Monocytes Relative: 9 %
Neutro Abs: 5.3 10*3/uL (ref 1.7–7.7)
Neutrophils Relative %: 64 %
Platelets: 349 10*3/uL (ref 150–400)
RBC: 4.11 MIL/uL (ref 3.87–5.11)
RDW: 16.3 % — ABNORMAL HIGH (ref 11.5–15.5)
WBC: 8.2 10*3/uL (ref 4.0–10.5)
nRBC: 0 % (ref 0.0–0.2)

## 2023-11-24 LAB — I-STAT CHEM 8, ED
BUN: 18 mg/dL (ref 6–20)
Calcium, Ion: 1.11 mmol/L — ABNORMAL LOW (ref 1.15–1.40)
Chloride: 107 mmol/L (ref 98–111)
Creatinine, Ser: 0.8 mg/dL (ref 0.44–1.00)
Glucose, Bld: 99 mg/dL (ref 70–99)
HCT: 36 % (ref 36.0–46.0)
Hemoglobin: 12.2 g/dL (ref 12.0–15.0)
Potassium: 3.7 mmol/L (ref 3.5–5.1)
Sodium: 140 mmol/L (ref 135–145)
TCO2: 22 mmol/L (ref 22–32)

## 2023-11-24 LAB — BASIC METABOLIC PANEL
Anion gap: 10 (ref 5–15)
BUN: 18 mg/dL (ref 6–20)
CO2: 22 mmol/L (ref 22–32)
Calcium: 8.9 mg/dL (ref 8.9–10.3)
Chloride: 105 mmol/L (ref 98–111)
Creatinine, Ser: 0.68 mg/dL (ref 0.44–1.00)
GFR, Estimated: >60 mL/min (ref >60)
Glucose, Bld: 102 mg/dL — ABNORMAL HIGH (ref 70–99)
Potassium: 3.8 mmol/L (ref 3.5–5.1)
Sodium: 137 mmol/L (ref 135–145)

## 2023-11-24 LAB — PREGNANCY, URINE: Preg Test, Ur: NEGATIVE

## 2023-11-24 NOTE — ED Triage Notes (Signed)
 Pt c/o several months of bilateral lower leg pains, burning in calves and especially at night, and weeks of urinary frequency. Hx transverse myelitis and pt notes she is worried about possible DVT, diabetes, and neuropathy. No SOB/CP/swelling/redness noted and no prior hx DVT. Recent MRI performed by neurology.

## 2023-11-24 NOTE — ED Notes (Signed)
 IV removed with catheter intact from left AC. No Previous line was noted in chart.

## 2023-11-24 NOTE — ED Provider Notes (Signed)
 [place] [facility] Provider Note   CSN: [id number] Arrival date & time: 11/24/23  0254     History  Chief Complaint  Patient presents with   Leg Pain   Urinary Frequency    Kimberly Briggs is a 48 y.o. female.  The history is provided by the patient.  Patient with history of osteoarthritis, previous history of transverse myelitis presents for multiple complaints  Patient reports ever since she was diagnosed with transverse myelitis in 2023 she has had chronic numbness in both of her legs.  Patient reports over the past several weeks she feels like she is having "a charley horse" cramp in her left calf.  She reports it mostly occurs at night and makes her wake up.  No swelling to the leg, no trauma No previous history of DVT  Patient also reports urinary frequency for weeks. No dysuria, no fevers or vomiting.  Patient also reports she suspects she has diabetes due to her urinary frequency  Patient is also requesting that we check her vitamin D levels.  Patient reports she saw her neurologist last year and is not felt that her transverse myelitis is worsening    Past Medical History:  Diagnosis Date   OA (osteoarthritis)    (Involving the bilateral knees)    Home Medications Prior to Admission medications   Medication Sig Start Date End Date Taking? Authorizing Provider  aspirin EC 325 MG tablet Take by mouth. 12/13/20   [provider]  lidocaine (LMX) 4 % cream Apply 1 Application topically daily as needed (pain).    [provider]  OVER THE COUNTER MEDICATION Place 2 drops in ear(s) daily as needed. [facility] ear drops "For infection" (OTC product)    [provider]      Allergies    Codeine    Review of Systems   Review of Systems  Physical Exam Updated Vital Signs BP 139/88 (BP Location: Right Arm)   Pulse 85   Temp 98.2 F (36.8 C) (Oral)   Resp 18   Ht 1.727 m (5\' 8" )   Wt 104.3 kg    LMP 11/16/2023 (Exact Date)   SpO2 100%   BMI 34.97 kg/m  Physical Exam CONSTITUTIONAL: Well developed/well nourished, anxious HEAD: Normocephalic/atraumatic EYES: EOMI/PERRL ENMT: Mucous membranes moist NECK: supple no meningeal signs CV: S1/S2 noted, no murmurs/rubs/gallops noted LUNGS: Lungs are clear to auscultation bilaterally, no apparent distress ABDOMEN: soft, nontender NEURO: Awake/alert, equal motor 5/5 strength noted with the following: hip flexion/knee flexion/extension, foot dorsi/plantar flexion, great toe extension intact bilaterally, no clonus bilaterally, no sensory deficit in any dermatome.  EXTREMITIES: pulses normal, full ROM Distal pulses equal intact in both feet. Legs are symmetric.  There is no calf tenderness. No lower extremity edema.  Full range of motion of both knees and ankles without difficulty SKIN: warm, color normal PSYCH: Anxious ED Results / Procedures / Treatments   Labs (all labs ordered are listed, but only abnormal results are displayed) Labs Reviewed  CBC WITH DIFFERENTIAL/PLATELET - Abnormal; Notable for the following components:      Result Value   Hemoglobin 10.7 (*)    HCT 33.6 (*)    RDW 16.3 (*)    All other components within normal limits  I-STAT CHEM 8, ED - Abnormal; Notable for the following components:   Calcium, Ion 1.11 (*)    All other components within normal limits  URINALYSIS, ROUTINE W REFLEX MICROSCOPIC  PREGNANCY, URINE  BASIC  METABOLIC PANEL    EKG None  Radiology No results found.  Procedures Procedures    Medications Ordered in ED Medications - No data to display  ED Course/ Medical Decision Making/ A&P Clinical Course as of 11/24/23 0655  Tue Nov 24, 2023  0600 Hemoglobin(!): 10.7 Stable chronic anemia [DW]  0607 Patient presents for multiple complaints including cramping in her legs, chronic numbness, concern for diabetes, urinary frequency, and requesting vitamin D level checked.  Patient  reports she has established with a PCP and will see next week but she could not wait to have all of these issues examined  At this point patient appears anxious but overall in no acute distress and her exam is reassuring Labs are pending at this time [DW]  0654 Labs are overall reassuring.  Postvoid residual was around 69 mL. Patient reports having urinary frequency for up to a month.  Will refer to urology.  No signs of urinary retention, no reports of fecal incontinence to suggest cauda equina syndrome.  Overall patient is safe for discharge home with follow-up as an outpatient [DW]    Clinical Course User Index [DW] Zadie Rhine, MD                                 Medical Decision Making Amount and/or Complexity of Data Reviewed Labs: ordered. Decision-making details documented in ED Course.           Final Clinical Impression(s) / ED Diagnoses Final diagnoses:  Leg cramps  Urinary frequency    Rx / DC Orders ED Discharge Orders     None         Zadie Rhine, MD 11/24/23 [phone]
